# Patient Record
Sex: Male | Born: 1965 | Race: White | Hispanic: No | Marital: Married | Smoking: Never smoker
Health system: Southern US, Community
[De-identification: ages and names within clinical notes are randomized; demographics above are authoritative.]

## PROBLEM LIST (undated history)

## (undated) DIAGNOSIS — F419 Anxiety disorder, unspecified: Secondary | ICD-10-CM

## (undated) DIAGNOSIS — E785 Hyperlipidemia, unspecified: Secondary | ICD-10-CM

## (undated) DIAGNOSIS — J45909 Unspecified asthma, uncomplicated: Secondary | ICD-10-CM

## (undated) DIAGNOSIS — E119 Type 2 diabetes mellitus without complications: Secondary | ICD-10-CM

## (undated) DIAGNOSIS — Z87442 Personal history of urinary calculi: Secondary | ICD-10-CM

## (undated) HISTORY — PX: TONSILLECTOMY: SUR1361

## (undated) HISTORY — PX: TYMPANOSTOMY TUBE PLACEMENT: SHX32

## (undated) HISTORY — PX: HERNIA REPAIR: SHX51

## (undated) HISTORY — DX: Hyperlipidemia, unspecified: E78.5

## (undated) HISTORY — PX: ADENOIDECTOMY: SUR15

## (undated) HISTORY — DX: Type 2 diabetes mellitus without complications: E11.9

## (undated) HISTORY — DX: Unspecified asthma, uncomplicated: J45.909

## (undated) HISTORY — DX: Personal history of urinary calculi: Z87.442

## (undated) HISTORY — DX: Anxiety disorder, unspecified: F41.9

---

## 2001-04-29 ENCOUNTER — Encounter: Admission: RE | Admit: 2001-04-29 | Discharge: 2001-07-28 | Payer: Self-pay | Admitting: Internal Medicine

## 2002-12-14 ENCOUNTER — Encounter: Payer: Self-pay | Admitting: Emergency Medicine

## 2002-12-14 ENCOUNTER — Emergency Department (HOSPITAL_COMMUNITY): Admission: EM | Admit: 2002-12-14 | Discharge: 2002-12-14 | Payer: Self-pay | Admitting: Emergency Medicine

## 2003-05-20 ENCOUNTER — Encounter: Payer: Self-pay | Admitting: Emergency Medicine

## 2003-05-20 ENCOUNTER — Emergency Department (HOSPITAL_COMMUNITY): Admission: EM | Admit: 2003-05-20 | Discharge: 2003-05-20 | Payer: Self-pay | Admitting: Emergency Medicine

## 2003-05-28 ENCOUNTER — Encounter: Payer: Self-pay | Admitting: Urology

## 2003-05-28 ENCOUNTER — Ambulatory Visit (HOSPITAL_COMMUNITY): Admission: RE | Admit: 2003-05-28 | Discharge: 2003-05-28 | Payer: Self-pay | Admitting: Urology

## 2005-09-02 ENCOUNTER — Emergency Department (HOSPITAL_COMMUNITY): Admission: EM | Admit: 2005-09-02 | Discharge: 2005-09-02 | Payer: Self-pay | Admitting: Emergency Medicine

## 2005-09-05 ENCOUNTER — Emergency Department (HOSPITAL_COMMUNITY): Admission: EM | Admit: 2005-09-05 | Discharge: 2005-09-05 | Payer: Self-pay | Admitting: Emergency Medicine

## 2005-09-14 ENCOUNTER — Ambulatory Visit (HOSPITAL_COMMUNITY): Admission: RE | Admit: 2005-09-14 | Discharge: 2005-09-14 | Payer: Self-pay | Admitting: Urology

## 2006-06-10 ENCOUNTER — Emergency Department (HOSPITAL_COMMUNITY): Admission: EM | Admit: 2006-06-10 | Discharge: 2006-06-10 | Payer: Self-pay | Admitting: Emergency Medicine

## 2006-07-04 ENCOUNTER — Emergency Department (HOSPITAL_COMMUNITY): Admission: EM | Admit: 2006-07-04 | Discharge: 2006-07-04 | Payer: Self-pay | Admitting: *Deleted

## 2006-12-03 ENCOUNTER — Encounter: Admission: RE | Admit: 2006-12-03 | Discharge: 2006-12-03 | Payer: Self-pay | Admitting: Internal Medicine

## 2008-06-15 ENCOUNTER — Ambulatory Visit: Payer: Self-pay | Admitting: Internal Medicine

## 2008-07-06 ENCOUNTER — Ambulatory Visit: Payer: Self-pay | Admitting: Internal Medicine

## 2008-12-25 ENCOUNTER — Ambulatory Visit: Payer: Self-pay | Admitting: Internal Medicine

## 2009-05-25 ENCOUNTER — Ambulatory Visit: Payer: Self-pay | Admitting: Internal Medicine

## 2009-09-17 ENCOUNTER — Ambulatory Visit: Payer: Self-pay | Admitting: Internal Medicine

## 2011-01-20 NOTE — H&P (Signed)
Jerry Arroyo, Jerry Arroyo                          ACCOUNT NO.:  [id number]   MEDICAL RECORD NO.:  [id number]                   PATIENT TYPE:  EMS   LOCATION:  [facility]                                 FACILITY:  [facility]   PHYSICIAN:  Celene Kras, MD                    DATE OF BIRTH:  1965-09-28   DATE OF ADMISSION:  05/20/2003  DATE OF DISCHARGE:                                HISTORY & PHYSICAL   IDENTIFYING INFORMATION/JUSTIFICATION FOR ADMISSION AND CARE:  This is a 45-  year-old white male with chief complaint of fever and shaking chills.   HISTORY OF PRESENT ILLNESS:  Acutely, this morning, felt generally poor at  breakfast.  Has been napping ever since off and on.  Has a headache in the  occiput radiating down to the neck.  This is not unusual for him.  Some mild  nausea as well.  Some lower abdominal pain that is mild as well.  He  developed shaking chills so his wife brought him in for evaluation.  He has  no sinus headache, nasal drainage, ear pain, sore throat, sputum production,  nausea, vomiting, diarrhea, constipation, rash or tick bite.  His fever in  the office on a rapid thermometer was 104.3.  A flow thermometer was 101.9.  Urine negative.  CBC with wbc within normal limits.  Chest x-ray was  unremarkable.  Decision was to admit for further evaluation to track febrile  illness.   ALLERGIES:  No known drug allergies.   CURRENT MEDICATIONS:  None.   PAST MEDICAL HISTORY:  1. DJD of the shoulders.  2. Adenomatous polyp of the colon (family history of colon cancer).  3. History of colonic lipoma.  4. History of diverticulosis.  5. History of subcutaneous lipoma.  6. History of elevated cholesterol.   PAST SURGICAL HISTORY:  1. Laser ablation of subcutaneous lipoma at the sigmoid junction by Dr.     Baldo Ash.  2. Adenomatous colon polyp removed.   FAMILY HISTORY:  Mother died at age 10 of colon cancer.  Father died at age  83 of an MI.  He has eight  siblings, four living.  One brother with prostate  cancer.  Three others in good health.  One brother died of an MI.   SOCIAL HISTORY:  Two daughters and one son.  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  He is not a smoker and does not drink  alcohol.   REVIEW OF SYMPTOMS:  Please see chief complaint.   PHYSICAL EXAMINATION:  GENERAL APPEARANCE:  A flushed, frail male with  shaking chills that looks acutely ill.  VITAL SIGNS:  Temperature 104.3, 101.9.  Pulse 72, blood pressure 140/80.  SKIN:  Hot to touch.  No rash.  HEENT:  PERRL.  Ears clear.  Oropharynx clear though dry.  NECK:  Thyroid not enlarged or tender.  No lymphadenopathy.  No carotid  bruit.  CHEST:  Clear.  CARDIOVASCULAR:  Normal S1 and S2 with no murmurs, gallops, rubs or clicks.  ABDOMEN:  Mildly tender in the bilateral lower quadrants.  No masses or  organomegaly.  No rebound or peritonitis.  GENITALIA:  Normal penis, testes normal.  RECTAL:  Good tone.  Nontender prostate.  No enlargement or nodules.  EXTREMITIES:  No clubbing, cyanosis, or edema.  NEUROLOGIC:  Oriented x3.   ASSESSMENT:  Fever of unknown origin.   PLAN:  Admit for further evaluation and treatment.  A consult with Dr. Merlene Laughter.      Jerry Arroyo, N.P.                     Celene Kras, MD    MAP/MEDQ  D:  05/21/2003  T:  05/21/2003  Job:  [id number]

## 2011-09-12 ENCOUNTER — Ambulatory Visit (INDEPENDENT_AMBULATORY_CARE_PROVIDER_SITE_OTHER): Payer: Commercial Managed Care - PPO | Admitting: Internal Medicine

## 2011-09-12 ENCOUNTER — Encounter: Payer: Self-pay | Admitting: Internal Medicine

## 2011-09-12 VITALS — BP 128/90 | HR 84 | Temp 98.5°F | Ht 75.0 in | Wt 253.0 lb

## 2011-09-12 DIAGNOSIS — H6690 Otitis media, unspecified, unspecified ear: Secondary | ICD-10-CM

## 2011-09-12 DIAGNOSIS — J329 Chronic sinusitis, unspecified: Secondary | ICD-10-CM

## 2011-09-12 DIAGNOSIS — H6693 Otitis media, unspecified, bilateral: Secondary | ICD-10-CM

## 2011-09-12 NOTE — Progress Notes (Signed)
  Subjective:    Patient ID: Jerry Arroyo, male    DOB: Apr 29, 1966, 46 y.o.   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 No fever or shaking chills. Did have influenza immunization through his work. Has some discolored sputum production and discolored nasal drainage. Feels stopped up in his head and ears feel stuffy. Not hearing well. No sore throat.    Review of Systems     Objective:   Physical Exam HEENT exam: TMs are dull bilaterally and pink; pharynx slightly injected without exudate; neck is supple. Has left anterior cervical node that is slightly tender. Chest: Clear to auscultation. Sounds nasally congested when he speaks.        Assessment & Plan:  Bilateral otitis media  Sinusitis  Plan: Continue with Mucinex over-the-counter. Has Hycodan cough syrup at home to take at bedtime when necessary cough. Levaquin 500 milligrams per and sees #10 (1 by mouth daily with a meal.

## 2011-09-12 NOTE — Patient Instructions (Addendum)
Take Mucinex over-the-counter as directed. Take Levaquin 500 milligrams daily with a meal for 10 days. Call if not better in 2 weeks. May take Hycodan cough syrup 1 teaspoon at bedtime as needed for cough.

## 2011-10-26 ENCOUNTER — Ambulatory Visit (INDEPENDENT_AMBULATORY_CARE_PROVIDER_SITE_OTHER): Payer: Commercial Managed Care - PPO | Admitting: Internal Medicine

## 2011-10-26 ENCOUNTER — Encounter: Payer: Self-pay | Admitting: Internal Medicine

## 2011-10-26 VITALS — BP 126/92 | HR 80 | Temp 98.2°F | Wt 254.0 lb

## 2011-10-26 DIAGNOSIS — H6690 Otitis media, unspecified, unspecified ear: Secondary | ICD-10-CM

## 2011-10-26 DIAGNOSIS — H6693 Otitis media, unspecified, bilateral: Secondary | ICD-10-CM

## 2011-10-26 DIAGNOSIS — J069 Acute upper respiratory infection, unspecified: Secondary | ICD-10-CM

## 2011-10-26 LAB — POCT RAPID STREP A (OFFICE): Rapid Strep A Screen: NEGATIVE

## 2011-10-26 NOTE — Progress Notes (Signed)
  Subjective:    Patient ID: Jerry Arroyo, male    DOB: 1966/08/13, 46 y.o.   MRN: [id number]  HPI patient here recently with respiratory infection treated with Levaquin. He got better but now has an apparent new respiratory infection with discolored nasal drainage that seems to be going into his chest. No fever or shaking chills. 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    Review of Systems     Objective:   Physical Exam HEENT exam pharynx slightly injected. Rapid strep screen negative. TMs are full and dull bilaterally. Neck is supple without adenopathy. Chest clear.            Assessment & Plan:  URI  Bilateral otitis media  Plan: Biaxin 500 mg by mouth twice daily for 10 days.

## 2011-10-26 NOTE — Patient Instructions (Signed)
Take Biaxin 500 mg by mouth twice daily for 10 days.

## 2011-10-26 NOTE — Progress Notes (Signed)
Addended by: Judy Pimple on: 10/26/2011 12:47 PM   Modules accepted: Orders

## 2011-12-25 ENCOUNTER — Ambulatory Visit (INDEPENDENT_AMBULATORY_CARE_PROVIDER_SITE_OTHER): Payer: Commercial Managed Care - PPO | Admitting: Internal Medicine

## 2011-12-25 VITALS — BP 118/60 | HR 80 | Temp 98.5°F | Resp 20 | Ht 76.0 in | Wt 240.0 lb

## 2011-12-25 DIAGNOSIS — J329 Chronic sinusitis, unspecified: Secondary | ICD-10-CM

## 2011-12-27 NOTE — Progress Notes (Signed)
  Subjective:    Patient ID: Jerry Arroyo, male    DOB: [birth year], 46 y.o.   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 Has had maxillary sinus pressure that started a couple of days ago. Daughter has had sore throat. Patient has had malaise and fatigue over the past 48 hours. Says he feels really awful. Cannot really blow out much discolored sputum. No fever or shaking chills.    Review of Systems     Objective:   Physical Exam boggy nasal mucosa, pharynx is clear, TMs are slightly full bilaterally. Tender  maxillary sinus areas bilaterally.        Assessment & Plan:  Sinusitis  Plan: Amoxicillin 500 mg by mouth 3 times daily for 10 days.

## 2011-12-31 ENCOUNTER — Encounter: Payer: Self-pay | Admitting: Internal Medicine

## 2011-12-31 NOTE — Patient Instructions (Signed)
Take amoxicillin 500 mg 3 times daily as directed for 10 days. Call if Not better in 10 days to 2 weeks.

## 2013-07-21 ENCOUNTER — Encounter: Payer: Self-pay | Admitting: Internal Medicine

## 2013-07-21 ENCOUNTER — Ambulatory Visit (INDEPENDENT_AMBULATORY_CARE_PROVIDER_SITE_OTHER): Payer: Commercial Managed Care - PPO | Admitting: Internal Medicine

## 2013-07-21 VITALS — BP 140/78 | HR 72 | Temp 97.2°F | Ht 76.0 in | Wt 255.0 lb

## 2013-07-21 DIAGNOSIS — E119 Type 2 diabetes mellitus without complications: Secondary | ICD-10-CM

## 2013-07-21 DIAGNOSIS — H6690 Otitis media, unspecified, unspecified ear: Secondary | ICD-10-CM

## 2013-07-21 DIAGNOSIS — Z87442 Personal history of urinary calculi: Secondary | ICD-10-CM

## 2013-07-21 DIAGNOSIS — Z131 Encounter for screening for diabetes mellitus: Secondary | ICD-10-CM

## 2013-07-21 DIAGNOSIS — H6692 Otitis media, unspecified, left ear: Secondary | ICD-10-CM

## 2013-07-21 DIAGNOSIS — H8110 Benign paroxysmal vertigo, unspecified ear: Secondary | ICD-10-CM

## 2013-07-21 LAB — POCT URINALYSIS DIPSTICK
Bilirubin, UA: NEGATIVE
Blood, UA: NEGATIVE
Leukocytes, UA: NEGATIVE
Nitrite, UA: NEGATIVE
Protein, UA: NEGATIVE
Spec Grav, UA: 1.015
Urobilinogen, UA: 0.2
pH, UA: 5.5

## 2013-07-21 LAB — COMPREHENSIVE METABOLIC PANEL
ALT: 49 U/L (ref 0–53)
AST: 34 U/L (ref 0–37)
Albumin: 4.2 g/dL (ref 3.5–5.2)
BUN: 11 mg/dL (ref 6–23)
CO2: 24 mEq/L (ref 19–32)
Calcium: 9.3 mg/dL (ref 8.4–10.5)
Creat: 0.7 mg/dL (ref 0.50–1.35)
Glucose, Bld: 215 mg/dL — ABNORMAL HIGH (ref 70–99)
Potassium: 3.6 mEq/L (ref 3.5–5.3)
Sodium: 139 mEq/L (ref 135–145)
Total Bilirubin: 0.4 mg/dL (ref 0.3–1.2)
Total Protein: 7 g/dL (ref 6.0–8.3)

## 2013-07-22 DIAGNOSIS — Z87442 Personal history of urinary calculi: Secondary | ICD-10-CM | POA: Insufficient documentation

## 2013-07-22 DIAGNOSIS — E119 Type 2 diabetes mellitus without complications: Secondary | ICD-10-CM | POA: Insufficient documentation

## 2013-07-22 LAB — HEMOGLOBIN A1C
Hgb A1c MFr Bld: 7.2 % — ABNORMAL HIGH (ref <5.7)
Mean Plasma Glucose: 160 mg/dL — ABNORMAL HIGH (ref <117)

## 2013-07-22 NOTE — Patient Instructions (Signed)
Take Accu-Cheks before breakfast and supper and keep a record for the next 2 weeks. Take medications as prescribed for ear infection. Follow diabetic diet and exercise

## 2013-07-22 NOTE — Progress Notes (Signed)
  Subjective:    Patient ID: Jerry Arroyo, male    DOB: Dec 21, 1965, 47 y.o.   MRN: [id number]  HPI Complaining of left ear being stopped up for about 4 weeks. Sounds are muffled. Has had a recent URI with a lot of discolored nasal drainage. No fever or chills. No sore throat. Right ear seems to be okay. Has had a bit of vertigo at times. 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  Also he went to see Dr. Bjorn Pippin today regarding kidney stone followup and was found to have glucose in his urine. Says he had it last year but never followed through with me about it. No complaints of polyuria or polydipsia. Urine has 1+ glucose today    Review of Systems     Objective:   Physical Exam left TM is full but clear. Right TM is clear. Pharynx is clear. Neck is supple without adenopathy. Chest is clear to auscultation. Labs drawn including fasting glucose and hemoglobin A1c        Assessment & Plan:  New onset diabetes mellitus  Benign positional vertigo  Left otitis media  Plan: Levaquin 500 milligrams daily for 7 days. Medrol Dosepak 4 mg take as corrected for 6 day period home Accu-Chek machine prescription given along with test strips and lancets to keep record of Accu-Chek of the next 2 weeks or so.  Addendum: Hemoglobin A1c 7.2%. Start following strict diabetic diet and exercise and return in 4-6 weeks

## 2013-07-28 ENCOUNTER — Telehealth: Payer: Self-pay | Admitting: Internal Medicine

## 2013-07-28 NOTE — Telephone Encounter (Signed)
New onset diabetes. Has been checking Accu-Cheks regularly. Has not over respiratory infection completely. Says ear still feels stuffy. New prescription for Levaquin 500 milligrams daily for 7 days.

## 2014-08-14 ENCOUNTER — Encounter: Payer: Self-pay | Admitting: Internal Medicine

## 2014-08-14 ENCOUNTER — Ambulatory Visit (INDEPENDENT_AMBULATORY_CARE_PROVIDER_SITE_OTHER): Payer: 59 | Admitting: Internal Medicine

## 2014-08-14 VITALS — BP 118/78 | HR 87 | Temp 97.8°F | Wt 256.0 lb

## 2014-08-14 DIAGNOSIS — H6693 Otitis media, unspecified, bilateral: Secondary | ICD-10-CM

## 2014-08-14 MED ORDER — AMOXICILLIN-POT CLAVULANATE 875-125 MG PO TABS: 1.0000 | ORAL_TABLET | Freq: Two times a day (BID) | ORAL | Status: DC

## 2014-08-14 NOTE — Patient Instructions (Signed)
Take Augmentin 875 mg twice daily for 10 days. Call if not better in 2 weeks.

## 2014-08-14 NOTE — Progress Notes (Signed)
   Subjective:    Patient ID: Jerry Arroyo, male    DOB: 1965/11/04, 48 y.o.   MRN: [id number]  HPI Patient complaining of a roaring sound and decreased hearing in left ear for about a month. Trouble hearing people over the telephone with left ear. No complaint with right ear. 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 He calls patients on the phone in [place] states.    Review of Systems     Objective:   Physical Exam  Right TM is dull and centrally retracted but not red. Left TM is full but not red. Pharynx is clear. Neck is supple without adenopathy.      Assessment & Plan:  Bilateral otitis media  Plan: Augmentin 875 mg twice daily for 10 days. Call if symptoms do not improve. Patient says he will book a physical exam in the near future.

## 2014-10-08 ENCOUNTER — Encounter: Payer: Self-pay | Admitting: Internal Medicine

## 2014-10-08 ENCOUNTER — Ambulatory Visit (INDEPENDENT_AMBULATORY_CARE_PROVIDER_SITE_OTHER): Payer: 59 | Admitting: Internal Medicine

## 2014-10-08 VITALS — BP 120/82 | HR 86 | Temp 98.0°F

## 2014-10-08 DIAGNOSIS — J069 Acute upper respiratory infection, unspecified: Secondary | ICD-10-CM

## 2014-10-08 DIAGNOSIS — H6505 Acute serous otitis media, recurrent, left ear: Secondary | ICD-10-CM

## 2014-10-08 MED ORDER — HYDROCODONE-HOMATROPINE 5-1.5 MG/5ML PO SYRP: 5.0000 mL | ORAL_SOLUTION | Freq: Three times a day (TID) | ORAL | Status: DC | PRN

## 2014-10-08 MED ORDER — LEVOFLOXACIN 500 MG PO TABS: 500.0000 mg | ORAL_TABLET | Freq: Every day | ORAL | Status: DC

## 2014-10-08 NOTE — Progress Notes (Signed)
   Subjective:    Patient ID: Jerry Arroyo, male    DOB: [birth year], 49 y.o.   MRN: [id number]  HPI Onset URI symptoms 4 days ago. No fever. No sore throat. Had flu vaccine. Trouble hearing out of left ear. Sounds are muffled. Patient was here December 11 treated with Augmentin for complaining of muffled hearing left ear. Says he got some better but now is having issues again. Daughter has had respiratory infection recently. Feels he probably caught that from her. Complaining of maxillary sinus tenderness. Some cough at night.    Review of Systems     Objective:   Physical Exam  Left TM is slightly full with splayed light reflex but not red. Right TM is chronically scarred. Pharynx is clear. Neck is supple. Chest clear.      Assessment & Plan:  Acute URI  Left serous otitis media  Plan: Levaquin 500 milligrams daily for 10 days. Hycodan 1 teaspoon by mouth every 8 hours when necessary cough no refill. May take DayQuil over-the-counter for congestion during the day. It ear symptoms persist, he will need to see ENT physician.

## 2014-10-08 NOTE — Patient Instructions (Signed)
Take Levaquin 500 milligrams daily for 10 days. Take DayQuil for congestion. Call if not better in 10 days or sooner if worse. 2 ENT physician if ear symptoms persist. Hycodan sparingly as needed for cough

## 2014-12-14 ENCOUNTER — Other Ambulatory Visit: Payer: 59 | Admitting: Internal Medicine

## 2014-12-14 DIAGNOSIS — Z125 Encounter for screening for malignant neoplasm of prostate: Secondary | ICD-10-CM

## 2014-12-14 DIAGNOSIS — E119 Type 2 diabetes mellitus without complications: Secondary | ICD-10-CM

## 2014-12-14 DIAGNOSIS — Z13220 Encounter for screening for lipoid disorders: Secondary | ICD-10-CM

## 2014-12-14 DIAGNOSIS — Z0000 Encounter for general adult medical examination without abnormal findings: Secondary | ICD-10-CM

## 2014-12-14 LAB — CBC WITH DIFFERENTIAL/PLATELET
Basophils Absolute: 0 10*3/uL (ref 0.0–0.1)
Basophils Relative: 0 % (ref 0–1)
Eosinophils Absolute: 0.3 10*3/uL (ref 0.0–0.7)
Eosinophils Relative: 4 % (ref 0–5)
HCT: 45.8 % (ref 39.0–52.0)
Hemoglobin: 15.4 g/dL (ref 13.0–17.0)
Lymphocytes Relative: 32 % (ref 12–46)
Lymphs Abs: 2.1 10*3/uL (ref 0.7–4.0)
MCH: 29.5 pg (ref 26.0–34.0)
MCHC: 33.6 g/dL (ref 30.0–36.0)
MCV: 87.7 fL (ref 78.0–100.0)
MPV: 10.3 fL (ref 8.6–12.4)
Monocytes Absolute: 0.6 10*3/uL (ref 0.1–1.0)
Monocytes Relative: 9 % (ref 3–12)
Neutro Abs: 3.6 10*3/uL (ref 1.7–7.7)
Neutrophils Relative %: 55 % (ref 43–77)
Platelets: 267 10*3/uL (ref 150–400)
RBC: 5.22 MIL/uL (ref 4.22–5.81)
RDW: 14 % (ref 11.5–15.5)
WBC: 6.6 10*3/uL (ref 4.0–10.5)

## 2014-12-14 LAB — COMPLETE METABOLIC PANEL WITH GFR
ALT: 48 U/L (ref 0–53)
AST: 33 U/L (ref 0–37)
Albumin: 4.3 g/dL (ref 3.5–5.2)
Alkaline Phosphatase: 53 U/L (ref 39–117)
BUN: 11 mg/dL (ref 6–23)
CO2: 25 mEq/L (ref 19–32)
Calcium: 9.1 mg/dL (ref 8.4–10.5)
Chloride: 102 mEq/L (ref 96–112)
Creat: 0.69 mg/dL (ref 0.50–1.35)
GFR, Est African American: >89 mL/min
GFR, Est Non African American: >89 mL/min
Glucose, Bld: 203 mg/dL — ABNORMAL HIGH (ref 70–99)
Potassium: 4.4 mEq/L (ref 3.5–5.3)
SODIUM: 139 meq/L (ref 135–145)
Total Bilirubin: 0.6 mg/dL (ref 0.2–1.2)
Total Protein: 6.9 g/dL (ref 6.0–8.3)

## 2014-12-14 LAB — LIPID PANEL
Cholesterol: 223 mg/dL — ABNORMAL HIGH (ref 0–200)
HDL: 32 mg/dL — ABNORMAL LOW (ref >=40)
LDL Cholesterol: 139 mg/dL — ABNORMAL HIGH (ref 0–99)
Total CHOL/HDL Ratio: 7 Ratio
Triglycerides: 260 mg/dL — ABNORMAL HIGH (ref <150)
VLDL: 52 mg/dL — AB (ref 0–40)

## 2014-12-14 NOTE — Addendum Note (Signed)
Addended by: Leota Jacobsen on: 12/14/2014 09:38 AM   Modules accepted: Orders

## 2014-12-15 ENCOUNTER — Ambulatory Visit (INDEPENDENT_AMBULATORY_CARE_PROVIDER_SITE_OTHER): Payer: 59 | Admitting: Internal Medicine

## 2014-12-15 ENCOUNTER — Encounter: Payer: Self-pay | Admitting: Internal Medicine

## 2014-12-15 VITALS — BP 126/76 | HR 86 | Temp 98.1°F | Ht 76.0 in | Wt 240.0 lb

## 2014-12-15 DIAGNOSIS — E669 Obesity, unspecified: Secondary | ICD-10-CM | POA: Diagnosis not present

## 2014-12-15 DIAGNOSIS — Z23 Encounter for immunization: Secondary | ICD-10-CM | POA: Diagnosis not present

## 2014-12-15 DIAGNOSIS — E119 Type 2 diabetes mellitus without complications: Secondary | ICD-10-CM

## 2014-12-15 DIAGNOSIS — Z0000 Encounter for general adult medical examination without abnormal findings: Secondary | ICD-10-CM

## 2014-12-15 DIAGNOSIS — E785 Hyperlipidemia, unspecified: Secondary | ICD-10-CM | POA: Diagnosis not present

## 2014-12-15 LAB — HEMOGLOBIN A1C
Hgb A1c MFr Bld: 10 % — ABNORMAL HIGH (ref <5.7)
Mean Plasma Glucose: 240 mg/dL — ABNORMAL HIGH (ref <117)

## 2014-12-15 LAB — POCT URINALYSIS DIPSTICK
Bilirubin, UA: NEGATIVE
Blood, UA: NEGATIVE
Leukocytes, UA: NEGATIVE
Nitrite, UA: NEGATIVE
Protein, UA: NEGATIVE
Spec Grav, UA: 1.02
Urobilinogen, UA: NEGATIVE
pH, UA: 5

## 2014-12-15 LAB — PSA: PSA: 0.81 ng/mL (ref <=4.00)

## 2014-12-15 MED ORDER — ATORVASTATIN CALCIUM 10 MG PO TABS: 10.0000 mg | ORAL_TABLET | Freq: Every day | ORAL | Status: DC

## 2014-12-15 MED ORDER — LINAGLIPTIN 5 MG PO TABS: 5.0000 mg | ORAL_TABLET | Freq: Every day | ORAL | Status: DC

## 2014-12-15 NOTE — Progress Notes (Signed)
Subjective:    Patient ID: Jerry Arroyo, male    DOB: 12/01/1965, 49 y.o.   MRN: [id number]  HPI  49 year old White male in today for health maintenance exam and evaluation of medical issues. He has type 2 diabetes mellitus and is currently not on treatment. He also has hyperlipidemia. Needs to be a medication for both. 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 Discussed diet and exercise. Will refer to Nutrition management at [address]. Has been known to have some glucose intolerance since at least November 2014 when he had some glucose in his urine. At that time was given an Accu-Chek machine. Hemoglobin A1c was 7.2% but did not follow-up. Hemoglobin A1c today is 10%. Also, total cholesterol, LDL cholesterol and triglycerides are elevated. Blood pressure is normal.  Has a history of calcium oxalate kidney stones and is maintained on potassium citrate for that by urologist. Had left lithotripsy 2007.  Doesn't recall date of last tetanus immunization so update given today. Prevnar 13 given today.  The says dialogue it causes nausea and vomiting  Left inguinal hernia repair 1983. Right inguinal hernia repair 1998. Peptic ulcer disease at age 22.  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 Had hepatitis B series 06/17/1991, 07/24/1991, 02/03/1992. In 1999 had a positive hepatitis B titer. This was done by the city of [place]. Had MMR vaccine 10/13/2008.  Social history: He is married. 2 daughters. Wife is a Pharmacist, hospital. Completed nursing school at [employer]. 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 Does not smoke or consume alcohol.  Had normal EKG in 2008.  Lateral epicondylitis right elbow 2005    Review of Systems  Constitutional: Negative.   Eyes:       Not seeing as well. Vision is  somewhat blurry  Respiratory: Negative.   Cardiovascular: Negative.   Gastrointestinal: Negative.   Endocrine: Negative.   Genitourinary: Negative.   Neurological: Negative.   Hematological: Negative.        Objective:   Physical Exam  Constitutional: He is oriented to person, place, and time. He appears well-developed and well-nourished. No distress.  HENT:  Head: Normocephalic and atraumatic.  Right Ear: External ear normal.  Left Ear: External ear normal.  Mouth/Throat: Oropharynx is clear and moist. No oropharyngeal exudate.  Eyes: Conjunctivae and EOM are normal. Pupils are equal, round, and reactive to light. Right eye exhibits no discharge. Left eye exhibits no discharge. No scleral icterus.  Neck: Neck supple. No JVD present. No thyromegaly present.  Cardiovascular: Normal rate, regular rhythm, normal heart sounds and intact distal pulses.   No murmur heard. Pulmonary/Chest: Effort normal and breath sounds normal. No respiratory distress. He has no wheezes. He has no rales. He exhibits no tenderness.  Abdominal: Soft. Bowel sounds are normal. He exhibits no distension and no mass. There is no rebound and no guarding.  Genitourinary: Prostate normal.  Musculoskeletal: Normal range of motion. He exhibits no edema.  Lymphadenopathy:    He has no cervical adenopathy.  Neurological: He is alert and oriented to person, place, and time. He has normal reflexes. No cranial nerve deficit. Coordination normal.  Skin: Skin is warm and dry. No rash noted. He is not diaphoretic. No erythema.  Psychiatric: He has a normal mood and affect. His behavior is normal. Judgment  and thought content normal.  Vitals reviewed.         Assessment & Plan:  New onset diabetes mellitus  Hyperlipidemia  Obesity  Plan: Referral to nutrition management Center. Prescription for Accu-Chek and test strips to check before meals and at bedtime. Return in 3 months for hemoglobin A 1C lipid panel and  liver functions. Start Lipitor 10 mg daily. Start Tradjenta 5 mg daily.

## 2014-12-15 NOTE — Patient Instructions (Addendum)
Start Tradjenta 5 mg daily. Start Lipitor 10 mg daily. Hold off on eye exam for 6-8 weeks. Purchase home glucose monitor and check glucose twice daily before meals. Prevnar and Tdap given. See dietitian regarding diabetes and cholesterol issues. Return in 3 months.

## 2014-12-16 LAB — MICROALBUMIN / CREATININE URINE RATIO
Creatinine, Urine: 75.7 mg/dL
Microalb Creat Ratio: 5.3 mg/g (ref 0.0–30.0)
Microalb, Ur: 0.4 mg/dL (ref <2.0)

## 2015-02-10 ENCOUNTER — Other Ambulatory Visit: Payer: Self-pay | Admitting: *Deleted

## 2015-02-10 MED ORDER — LINAGLIPTIN 5 MG PO TABS: 5.0000 mg | ORAL_TABLET | Freq: Every day | ORAL | Status: DC

## 2015-02-10 NOTE — Telephone Encounter (Signed)
Refill sent on Tradjenta

## 2015-03-12 ENCOUNTER — Other Ambulatory Visit: Payer: 59 | Admitting: Internal Medicine

## 2015-03-12 DIAGNOSIS — E119 Type 2 diabetes mellitus without complications: Secondary | ICD-10-CM

## 2015-03-12 DIAGNOSIS — E785 Hyperlipidemia, unspecified: Secondary | ICD-10-CM

## 2015-03-12 DIAGNOSIS — Z79899 Other long term (current) drug therapy: Secondary | ICD-10-CM

## 2015-03-12 LAB — LIPID PANEL
Cholesterol: 163 mg/dL (ref 0–200)
HDL: 37 mg/dL — AB (ref >=40)
LDL Cholesterol: 106 mg/dL — ABNORMAL HIGH (ref 0–99)
Total CHOL/HDL Ratio: 4.4 Ratio
Triglycerides: 102 mg/dL (ref <150)
VLDL: 20 mg/dL (ref 0–40)

## 2015-03-12 LAB — HEPATIC FUNCTION PANEL
ALT: 15 U/L (ref 0–53)
AST: 12 U/L (ref 0–37)
Albumin: 4 g/dL (ref 3.5–5.2)
Alkaline Phosphatase: 47 U/L (ref 39–117)
BILIRUBIN INDIRECT: 0.3 mg/dL (ref 0.2–1.2)
BILIRUBIN TOTAL: 0.4 mg/dL (ref 0.2–1.2)
Bilirubin, Direct: 0.1 mg/dL (ref 0.0–0.3)
Total Protein: 7.1 g/dL (ref 6.0–8.3)

## 2015-03-12 LAB — HEMOGLOBIN A1C
Hgb A1c MFr Bld: 6.6 % — ABNORMAL HIGH (ref <5.7)
Mean Plasma Glucose: 143 mg/dL — ABNORMAL HIGH (ref <117)

## 2015-03-15 ENCOUNTER — Encounter: Payer: Self-pay | Admitting: Internal Medicine

## 2015-03-15 ENCOUNTER — Ambulatory Visit (INDEPENDENT_AMBULATORY_CARE_PROVIDER_SITE_OTHER): Payer: 59 | Admitting: Internal Medicine

## 2015-03-15 VITALS — BP 118/82 | HR 71 | Temp 97.4°F | Wt 242.0 lb

## 2015-03-15 DIAGNOSIS — E119 Type 2 diabetes mellitus without complications: Secondary | ICD-10-CM | POA: Diagnosis not present

## 2015-03-15 DIAGNOSIS — E785 Hyperlipidemia, unspecified: Secondary | ICD-10-CM

## 2015-03-15 DIAGNOSIS — E669 Obesity, unspecified: Secondary | ICD-10-CM

## 2015-03-15 NOTE — Patient Instructions (Signed)
Continue medications as previously prescribed. Get eyes checked. Continue same medications. Work on diet and exercise. Return in 4 months

## 2015-03-15 NOTE — Progress Notes (Signed)
   Subjective:    Patient ID: Jerry Arroyo, male    DOB: 20-Sep-1965, 49 y.o.   MRN: [id number]  HPI 49 year old Male with recent diagnosis of diabetes out of control with hemoglobin A1c at last visit 3 months ago 10%. He has now gotten it down to 6.6% with Tradjenta. He's tolerating that well. He is watching his diet. He was also started on Lipitor 10 mg daily for hyperlipidemia. His lipid panel is now much improved with LDL improving from 139-106. Triglycerides improved from 260-102. Total cholesterol improved from 223-163. His weight is 242 pounds. He needs to continue with weight loss efforts. He's actually gained 2 pounds since last visit in April.  In April he was given pneumococcal 13 vaccine. This was updated in his record today.  He has yet to get diabetic eye exam.      Review of Systems     Objective:   Physical Exam  Neck is supple without JVD thyromegaly or carotid bruits. Chest clear. Cardiac exam regular rate and rhythm. Extremities without edema      Assessment & Plan:  Controlled type 2 diabetes mellitus  Hyperlipidemia  Obesity  Plan: Patient will return in 4 months for office visit, hemoglobin A1c lipid panel with known liver functions. Liver panel was normal today. Needs to get eyes examined. Continue to work on diet and exercise and weight loss efforts.  I am holding off on starting him on ACE inhibitor or ARB at present. Blood pressure is 118/82.

## 2015-05-26 ENCOUNTER — Other Ambulatory Visit: Payer: Self-pay | Admitting: *Deleted

## 2015-05-26 MED ORDER — ATORVASTATIN CALCIUM 10 MG PO TABS: 10.0000 mg | ORAL_TABLET | Freq: Every day | ORAL | Status: DC

## 2015-07-09 ENCOUNTER — Other Ambulatory Visit: Payer: 59 | Admitting: Internal Medicine

## 2015-07-09 DIAGNOSIS — E118 Type 2 diabetes mellitus with unspecified complications: Secondary | ICD-10-CM

## 2015-07-09 LAB — HEMOGLOBIN A1C
Hgb A1c MFr Bld: 6.5 % — ABNORMAL HIGH (ref <5.7)
Mean Plasma Glucose: 140 mg/dL — ABNORMAL HIGH (ref <117)

## 2015-07-10 LAB — MICROALBUMIN, URINE: MICROALB UR: 0.7 mg/dL

## 2015-07-12 ENCOUNTER — Other Ambulatory Visit: Payer: 59 | Admitting: Internal Medicine

## 2015-07-12 ENCOUNTER — Encounter: Payer: Self-pay | Admitting: Internal Medicine

## 2015-07-12 ENCOUNTER — Other Ambulatory Visit: Payer: Self-pay

## 2015-07-12 ENCOUNTER — Ambulatory Visit (INDEPENDENT_AMBULATORY_CARE_PROVIDER_SITE_OTHER): Payer: 59 | Admitting: Internal Medicine

## 2015-07-12 VITALS — BP 112/70 | HR 78 | Temp 97.5°F | Resp 18 | Ht 76.0 in | Wt 241.0 lb

## 2015-07-12 DIAGNOSIS — E785 Hyperlipidemia, unspecified: Secondary | ICD-10-CM | POA: Diagnosis not present

## 2015-07-12 DIAGNOSIS — H9192 Unspecified hearing loss, left ear: Secondary | ICD-10-CM

## 2015-07-12 DIAGNOSIS — E119 Type 2 diabetes mellitus without complications: Secondary | ICD-10-CM | POA: Diagnosis not present

## 2015-07-12 MED ORDER — METFORMIN HCL 500 MG PO TABS: 500.0000 mg | ORAL_TABLET | Freq: Two times a day (BID) | ORAL | Status: DC

## 2015-07-12 NOTE — Progress Notes (Signed)
   Subjective:    Patient ID: Jerry Arroyo, male    DOB: 08-06-1966, 49 y.o.   MRN: [id number]  HPI  He is in today to follow-up on controlled type 2 diabetes mellitus. He is on for gentle. Hemoglobin A1c has improved from 6.6% 4 months ago 6.5%. 7 months ago was 10%. Says he's can tell his diabetic control seems to be high in the mornings. We talked about diet and exercise. He's complaining of hearing loss left ear. May need to see ENT physician.    Review of Systems     Objective:   Physical Exam  Spent 20 minutes speaking with patient about diabetic control. Lipid panel not checked this time. He remains on lipid-lowering medication.    Assessment & Plan:  Controlled type 2 diabetes mellitus  Hyperlipidemia-to be checked in April  Hearing loss-to see ENT physician  Plan: Were going to add metformin 500 mg twice daily to Tradjenta 5 mg daily. He will follow-up in late January with hemoglobin A1c without office visit. Physical exam due April 2017 and will be booked today. He will call if diarrhea develops well on metformin.

## 2015-07-12 NOTE — Patient Instructions (Signed)
Add metformin 500 mg twice daily to [place]. Return in late January for hemoglobin A1c without office visit. Physical exam booked for April 2017.

## 2015-07-13 MED ORDER — METFORMIN HCL 500 MG PO TABS: 500.0000 mg | ORAL_TABLET | Freq: Two times a day (BID) | ORAL | Status: DC

## 2015-07-15 ENCOUNTER — Other Ambulatory Visit: Payer: Self-pay | Admitting: Internal Medicine

## 2015-10-01 ENCOUNTER — Other Ambulatory Visit: Payer: 59 | Admitting: Internal Medicine

## 2015-10-01 DIAGNOSIS — E119 Type 2 diabetes mellitus without complications: Secondary | ICD-10-CM

## 2015-10-01 LAB — HEMOGLOBIN A1C
Hgb A1c MFr Bld: 6.3 % — ABNORMAL HIGH (ref <5.7)
Mean Plasma Glucose: 134 mg/dL — ABNORMAL HIGH (ref <117)

## 2015-10-04 ENCOUNTER — Other Ambulatory Visit: Payer: Self-pay | Admitting: Internal Medicine

## 2015-11-25 ENCOUNTER — Encounter: Payer: Self-pay | Admitting: Internal Medicine

## 2015-11-25 ENCOUNTER — Ambulatory Visit (INDEPENDENT_AMBULATORY_CARE_PROVIDER_SITE_OTHER): Payer: 59 | Admitting: Internal Medicine

## 2015-11-25 VITALS — BP 116/82 | HR 92 | Temp 98.0°F | Resp 18 | Ht 76.0 in | Wt 244.0 lb

## 2015-11-25 DIAGNOSIS — H9192 Unspecified hearing loss, left ear: Secondary | ICD-10-CM

## 2015-11-25 NOTE — Patient Instructions (Addendum)
To be referred to Dr. Idelle Crouch at [facility] for evaluation.

## 2015-11-25 NOTE — Progress Notes (Signed)
   Subjective:    Patient ID: Jerry Arroyo, male    DOB: 1966/06/11, 50 y.o.   MRN: [id number]  HPI He is back today complaining of a roaring once again in left ear. Says it feels like a ocean in his left ear. He has diabetes and hyperlipidemia. 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 No recent URI symptoms. Says he's noticed decreased hearing in his left ear for the past year to year and a half. He tried like a flavonoid without any relief. He denies tinnitus or high-pitched ringing in his ear. Clearly says it sounds like [place]. Sometimes feels very slightly dizzy if he spends around too quickly but has no frank Ryan spinning vertigo. No headache.    Review of Systems     Objective:   Physical Exam  Both TMs appear to be chronically scarred. The left TM is centrally injected. Hearing is normal in the right ear at 20 dB. He has hearing loss at 20 and 40 dB in his left ear. Cannot hear 500 nor 1000 Hz at either 20 or 40 dB. His pharynx is clear. Neck supple. Chest clear.      Assessment & Plan:  Hearing loss left ear  Plan: Refer to [place] at  [facility] for evaluation.

## 2015-11-29 ENCOUNTER — Other Ambulatory Visit: Payer: Self-pay

## 2015-11-29 MED ORDER — LEVOFLOXACIN 500 MG PO TABS: 500.0000 mg | ORAL_TABLET | Freq: Every day | ORAL | Status: DC

## 2015-11-29 NOTE — Telephone Encounter (Signed)
Patient was seen last week for trouble hearing. Was referred to ENT-Dr. Idelle Crouch- but will not be seen until May. He states that you and him had discussed an antibiotic if things didn't clear up. He states that things are getting worse, even since last week. Please advise.  Per Dr. Renold Genta give levaquin 500mg  1 po q day x 10 days. Levaquin sent to pharmacy.

## 2016-01-08 ENCOUNTER — Other Ambulatory Visit: Payer: Self-pay | Admitting: Internal Medicine

## 2016-01-10 NOTE — Telephone Encounter (Signed)
RF x 3 months

## 2016-02-18 ENCOUNTER — Other Ambulatory Visit: Payer: 59 | Admitting: Internal Medicine

## 2016-02-18 DIAGNOSIS — E785 Hyperlipidemia, unspecified: Secondary | ICD-10-CM

## 2016-02-18 DIAGNOSIS — E119 Type 2 diabetes mellitus without complications: Secondary | ICD-10-CM

## 2016-02-18 DIAGNOSIS — Z79899 Other long term (current) drug therapy: Secondary | ICD-10-CM

## 2016-02-18 DIAGNOSIS — Z125 Encounter for screening for malignant neoplasm of prostate: Secondary | ICD-10-CM

## 2016-02-18 DIAGNOSIS — Z0000 Encounter for general adult medical examination without abnormal findings: Secondary | ICD-10-CM

## 2016-02-18 LAB — COMPLETE METABOLIC PANEL WITH GFR
ALK PHOS: 49 U/L (ref 40–115)
ALT: 17 U/L (ref 9–46)
AST: 14 U/L (ref 10–35)
Albumin: 4.2 g/dL (ref 3.6–5.1)
BILIRUBIN TOTAL: 0.6 mg/dL (ref 0.2–1.2)
BUN: 11 mg/dL (ref 7–25)
CALCIUM: 9 mg/dL (ref 8.6–10.3)
CHLORIDE: 99 mmol/L (ref 98–110)
CO2: 28 mmol/L (ref 20–31)
Creat: 0.69 mg/dL — ABNORMAL LOW (ref 0.70–1.33)
GFR, Est African American: >89 mL/min (ref >=60)
GFR, Est Non African American: >89 mL/min (ref >=60)
GLUCOSE: 109 mg/dL — AB (ref 65–99)
POTASSIUM: 3.9 mmol/L (ref 3.5–5.3)
SODIUM: 138 mmol/L (ref 135–146)
Total Protein: 7.2 g/dL (ref 6.1–8.1)

## 2016-02-18 LAB — CBC WITH DIFFERENTIAL/PLATELET
Basophils Absolute: 88 cells/uL (ref 0–200)
Basophils Relative: 1 %
EOS PCT: 2 %
Eosinophils Absolute: 176 cells/uL (ref 15–500)
HCT: 41.8 % (ref 38.5–50.0)
Hemoglobin: 14.3 g/dL (ref 13.2–17.1)
LYMPHS PCT: 26 %
Lymphs Abs: 2288 cells/uL (ref 850–3900)
MCH: 29.7 pg (ref 27.0–33.0)
MCHC: 34.2 g/dL (ref 32.0–36.0)
MCV: 86.7 fL (ref 80.0–100.0)
MPV: 9.5 fL (ref 7.5–12.5)
Monocytes Absolute: 968 cells/uL — ABNORMAL HIGH (ref 200–950)
Monocytes Relative: 11 %
NEUTROS PCT: 60 %
Neutro Abs: 5280 cells/uL (ref 1500–7800)
Platelets: 243 10*3/uL (ref 140–400)
RBC: 4.82 MIL/uL (ref 4.20–5.80)
RDW: 14.1 % (ref 11.0–15.0)
WBC: 8.8 10*3/uL (ref 3.8–10.8)

## 2016-02-18 LAB — LIPID PANEL
CHOL/HDL RATIO: 3.5 ratio (ref <=5.0)
CHOLESTEROL: 151 mg/dL (ref 125–200)
HDL: 43 mg/dL (ref >=40)
LDL Cholesterol: 78 mg/dL (ref <130)
Triglycerides: 148 mg/dL (ref <150)
VLDL: 30 mg/dL (ref <30)

## 2016-02-19 LAB — PSA: PSA: 1.03 ng/mL (ref <=4.00)

## 2016-02-19 LAB — HEMOGLOBIN A1C
HEMOGLOBIN A1C: 6.7 % — AB (ref <5.7)
Mean Plasma Glucose: 146 mg/dL

## 2016-02-19 LAB — MICROALBUMIN, URINE: MICROALB UR: 1.2 mg/dL

## 2016-02-21 ENCOUNTER — Ambulatory Visit (INDEPENDENT_AMBULATORY_CARE_PROVIDER_SITE_OTHER): Payer: 59 | Admitting: Internal Medicine

## 2016-02-21 ENCOUNTER — Encounter: Payer: Self-pay | Admitting: Internal Medicine

## 2016-02-21 VITALS — BP 138/84 | HR 89 | Temp 98.5°F | Resp 18 | Ht 76.0 in | Wt 253.0 lb

## 2016-02-21 DIAGNOSIS — E785 Hyperlipidemia, unspecified: Secondary | ICD-10-CM

## 2016-02-21 DIAGNOSIS — Z23 Encounter for immunization: Secondary | ICD-10-CM

## 2016-02-21 DIAGNOSIS — Z87442 Personal history of urinary calculi: Secondary | ICD-10-CM

## 2016-02-21 DIAGNOSIS — H8109 Meniere's disease, unspecified ear: Secondary | ICD-10-CM | POA: Diagnosis not present

## 2016-02-21 DIAGNOSIS — E119 Type 2 diabetes mellitus without complications: Secondary | ICD-10-CM | POA: Diagnosis not present

## 2016-02-21 DIAGNOSIS — Z0000 Encounter for general adult medical examination without abnormal findings: Secondary | ICD-10-CM | POA: Diagnosis not present

## 2016-02-21 LAB — POCT URINALYSIS DIPSTICK
BILIRUBIN UA: NEGATIVE
GLUCOSE UA: NEGATIVE
KETONES UA: NEGATIVE
Leukocytes, UA: NEGATIVE
NITRITE UA: NEGATIVE
PH UA: 7
Protein, UA: NEGATIVE
RBC UA: NEGATIVE
Spec Grav, UA: 1.015
Urobilinogen, UA: 0.2

## 2016-02-21 MED ORDER — PNEUMOCOCCAL VAC POLYVALENT 25 MCG/0.5ML IJ INJ
0.5000 mL | INJECTION | Freq: Once | INTRAMUSCULAR | Status: AC
  Administered 2016-02-21: 0.5 mL via INTRAMUSCULAR

## 2016-02-21 NOTE — Progress Notes (Signed)
 Subjective:    Patient ID: Jerry Arroyo, male    DOB: 11/18/1965, 50 y.o.   MRN: [id number]  HPI 50 year old Male for health maintenance exam and evaluation of medical issues. Recently diagnosed with Meniere's disease by Dr. Pillsbury and started with HCTZ 12.5 mg x 2 daily.  Hx of diabetes mellitus. Hgb AIC has increased.History of hyperlipidemia. Parents moving to [place] in July. Father has dementia. 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 Doesn't do a lot of regular exercise outside of that.  History of glucose intolerance since at least November 2014 when he was noted have glucose in his urine. Hemoglobin A1c was 7.2%  but did not follow-up. Hemoglobin A1c in April 2016 was 10%.  He has a history of calcium oxalate kidney stones. He is followed by urologist and is maintained on potassium citrate for that. He had left lithotripsy 2007.  Patient says that after his last visit here April 2016 at which time a rectal exam was done that he came down with a severe case of prostatitis which was treated by urologist. Had never had prostatitis previously.  Prevnar 13 given in 2016 and Pneumovax 23 given today. Had tetanus immunization April 2016.  Left inguinal hernia repair 1983. Right inguinal hernia repair 1998. Peptic ulcer disease at age 13.  Had normal EKG in 2008  Right lateral epicondylitis 2005   In 1999, had positive Hepatitis B titer. 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 Had hepatitis B series 06/17/1991, 07/24/1991, 02/03/1992.  Social history: He is married. 2 daughters. Wife is a teacher. He completed nursing school at [employer]. 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 He works from home.  Family history: Father with dementia. Daughters in good  health.    Review of Systems  Constitutional: Positive for fatigue.  HENT:       Dizziness improved  Respiratory: Negative.   Cardiovascular: Negative.   Gastrointestinal: Negative.   Musculoskeletal: Negative.   Skin: Negative.   Psychiatric/Behavioral: Negative.        Objective:   Physical Exam  Constitutional: He is oriented to person, place, and time. He appears well-developed and well-nourished.  HENT:  Head: Normocephalic and atraumatic.  Right Ear: External ear normal.  Left Ear: External ear normal.  Mouth/Throat: Oropharynx is clear and moist. No oropharyngeal exudate.  Eyes: Conjunctivae and EOM are normal. Pupils are equal, round, and reactive to light. Right eye exhibits no discharge. Left eye exhibits no discharge.  Neck: Neck supple. No JVD present. No thyromegaly present.  Cardiovascular: Normal rate, regular rhythm, normal heart sounds and intact distal pulses.   No murmur heard. Pulmonary/Chest: Effort normal and breath sounds normal. No respiratory distress. He has no wheezes. He has no rales.  Abdominal: Soft. Bowel sounds are normal. He exhibits no distension and no mass. There is no tenderness. There is no rebound and no guarding.  Genitourinary:  Deferred to Dr. John Wrenn  Musculoskeletal: He exhibits no edema.  Lymphadenopathy:    He has no cervical adenopathy.  Neurological: He is alert and oriented to person, place, and time. He has normal reflexes. No cranial nerve deficit.  Skin: Skin is warm and dry. No rash noted.  Psychiatric: He has a normal mood and affect. His behavior is normal. Judgment and thought content normal.    Vitals reviewed.         Assessment & Plan:  Meniere's disease-Seen by Dr. Idelle Crouch and started on HCTZ with improvement in hearing  Controlled type 2 diabetes mellitus-Hemoglobin A1c 6.7%. Patient attributes this to stress with dealing with parents at present time  Hyperlipidemia-stable and normal on statin  medication  History of kidney stones  Plan: Continue to Trajenta 5 mg daily. Continue metformin. Follow-up in 6 months. Continue Lipitor daily.  Continue to monitor Accu-Cheks at least once daily. Immunizations are up-to-date. Pneumococcal 23 vaccine given today.

## 2016-02-22 NOTE — Patient Instructions (Signed)
Continue same medications and return in 6 months. Please have urologist do prostate exam. Pneumococcal 23 vaccine given today.

## 2016-03-04 ENCOUNTER — Other Ambulatory Visit: Payer: Self-pay | Admitting: Internal Medicine

## 2016-08-17 ENCOUNTER — Other Ambulatory Visit: Payer: 59 | Admitting: Internal Medicine

## 2016-08-17 DIAGNOSIS — E119 Type 2 diabetes mellitus without complications: Secondary | ICD-10-CM

## 2016-08-17 DIAGNOSIS — Z87442 Personal history of urinary calculi: Secondary | ICD-10-CM

## 2016-08-17 DIAGNOSIS — E785 Hyperlipidemia, unspecified: Secondary | ICD-10-CM

## 2016-08-17 LAB — HEMOGLOBIN A1C
HEMOGLOBIN A1C: 6.1 % — AB (ref <5.7)
MEAN PLASMA GLUCOSE: 128 mg/dL

## 2016-08-17 LAB — LIPID PANEL
CHOL/HDL RATIO: 4.4 ratio (ref <5.0)
CHOLESTEROL: 167 mg/dL (ref <200)
HDL: 38 mg/dL — ABNORMAL LOW (ref >40)
LDL Cholesterol: 85 mg/dL (ref <100)
Triglycerides: 218 mg/dL — ABNORMAL HIGH (ref <150)
VLDL: 44 mg/dL — ABNORMAL HIGH (ref <30)

## 2016-08-17 LAB — HEPATIC FUNCTION PANEL
ALBUMIN: 4.6 g/dL (ref 3.6–5.1)
ALK PHOS: 42 U/L (ref 40–115)
ALT: 20 U/L (ref 9–46)
AST: 15 U/L (ref 10–35)
BILIRUBIN DIRECT: 0.1 mg/dL (ref <=0.2)
BILIRUBIN TOTAL: 0.6 mg/dL (ref 0.2–1.2)
Indirect Bilirubin: 0.5 mg/dL (ref 0.2–1.2)
TOTAL PROTEIN: 7.5 g/dL (ref 6.1–8.1)

## 2016-08-18 LAB — MICROALBUMIN, URINE: Microalb, Ur: 0.5 mg/dL

## 2016-08-21 ENCOUNTER — Encounter: Payer: Self-pay | Admitting: Internal Medicine

## 2016-08-21 ENCOUNTER — Ambulatory Visit (INDEPENDENT_AMBULATORY_CARE_PROVIDER_SITE_OTHER): Payer: 59 | Admitting: Internal Medicine

## 2016-08-21 VITALS — BP 116/78 | HR 81 | Temp 98.5°F | Ht 76.0 in | Wt 247.0 lb

## 2016-08-21 DIAGNOSIS — E119 Type 2 diabetes mellitus without complications: Secondary | ICD-10-CM | POA: Diagnosis not present

## 2016-08-21 DIAGNOSIS — E782 Mixed hyperlipidemia: Secondary | ICD-10-CM

## 2016-08-21 DIAGNOSIS — H8109 Meniere's disease, unspecified ear: Secondary | ICD-10-CM

## 2016-08-21 MED ORDER — HYDROCHLOROTHIAZIDE 12.5 MG PO TABS: 25.0000 mg | ORAL_TABLET | Freq: Every day | ORAL | 2 refills | Status: DC

## 2016-08-21 NOTE — Patient Instructions (Signed)
Continue same medications and return in 6 months. Watch diet.

## 2016-08-21 NOTE — Progress Notes (Signed)
   Subjective:    Patient ID: Jerry Arroyo, male    DOB: 1965/12/01, 50 y.o.   MRN: [id number]  HPI 50 year old Male for 6 month follow up of DM and Hyperlipidemia. Triglycerides elevated at 218. History of Mnire's disease treated with HCTZ. This was refilled today. Has not been checking blood pressure on a regular basis. Probably needs to do that but blood pressure is acceptable today. Father has been diagnosed with diabetes as well. He remains on for gentle and metformin. Hemoglobin A1c has improved considerably from 6.7% to 6.1%. Remains on Lipitor 10 mg daily. We have discussed his elevated triglycerides. He'll continue to try to diet and exercise and we will follow up in 6 months. We may need to go up on Lipitor that time if this does not improve.    Review of Systems no new complaints. Has had diabetic eye exam and flu vaccine.     Objective:   Physical Exam Skin warm and dry. Nodes none. Neck is supple without JVD thyromegaly or carotid bruits. Chest clear to auscultation. Cardiac exam regular rate and rhythm normal S1 and S2. Extremities without edema.       Assessment & Plan:  Controlled type 2 diabetes mellitus-hemoglobin A1c excellent at 6.1%. Continue Cogentin metformin  Hypertriglyceridemia-continue Lipitor 10 mg daily and reevaluate in 6 months  Mnire's disease-continue HCTZ  Plan: Physical exam due in 6 months.

## 2016-09-04 HISTORY — PX: LAMINECTOMY AND MICRODISCECTOMY CERVICAL SPINE: SHX1912

## 2016-09-17 ENCOUNTER — Other Ambulatory Visit: Payer: Self-pay | Admitting: Internal Medicine

## 2016-10-06 ENCOUNTER — Other Ambulatory Visit: Payer: Self-pay | Admitting: Internal Medicine

## 2016-11-21 ENCOUNTER — Other Ambulatory Visit: Payer: Self-pay | Admitting: Internal Medicine

## 2016-12-18 ENCOUNTER — Encounter: Payer: Self-pay | Admitting: Internal Medicine

## 2016-12-18 ENCOUNTER — Ambulatory Visit (INDEPENDENT_AMBULATORY_CARE_PROVIDER_SITE_OTHER): Payer: 59 | Admitting: Internal Medicine

## 2016-12-18 VITALS — BP 120/80 | HR 73 | Temp 98.0°F | Wt 250.0 lb

## 2016-12-18 DIAGNOSIS — M546 Pain in thoracic spine: Secondary | ICD-10-CM | POA: Diagnosis not present

## 2016-12-18 DIAGNOSIS — G5601 Carpal tunnel syndrome, right upper limb: Secondary | ICD-10-CM

## 2016-12-18 DIAGNOSIS — J069 Acute upper respiratory infection, unspecified: Secondary | ICD-10-CM

## 2016-12-18 MED ORDER — LEVOFLOXACIN 500 MG PO TABS: 500.0000 mg | ORAL_TABLET | Freq: Every day | ORAL | 0 refills | Status: DC

## 2016-12-18 MED ORDER — METHYLPREDNISOLONE ACETATE 80 MG/ML IJ SUSP
80.0000 mg | Freq: Once | INTRAMUSCULAR | Status: AC
  Administered 2016-12-18: 80 mg via INTRAMUSCULAR

## 2016-12-18 MED ORDER — CYCLOBENZAPRINE HCL 10 MG PO TABS: 10.0000 mg | ORAL_TABLET | Freq: Three times a day (TID) | ORAL | 0 refills | Status: DC | PRN

## 2016-12-18 NOTE — Patient Instructions (Signed)
Right parathoracic trigger point injected with Marcaine Xylocaine and Depo-Medrol. May take Flexeril at bedtime. Continue Naprosyn. Ice area for 20 minutes once or twice daily. 4 respiratory infection prescribed Levaquin 500 milligrams daily for 10 days.

## 2016-12-18 NOTE — Addendum Note (Signed)
Addended by: Inocencio Homes on: 12/18/2016 01:12 PM   Modules accepted: Orders

## 2016-12-18 NOTE — Progress Notes (Signed)
   Subjective:    Patient ID: Jerry Arroyo, male    DOB: 05/03/1966, 51 y.o.   MRN: [id number]  HPI  51 year old male with URI symptoms. Has cough and congestion but no fever or shaking chills. What is bothering him more today is some right parathoracic pain. He works a lot at [employer] and uses his right arm. Has some numbness and first 3 fingers of his right hand but is complaining bitterly of point tenderness in the right parathoracic muscle area with point tenderness on exam.    Review of Systems see above     Objective:   Physical Exam  He is tender in the right parathoracic muscle area. There is a trigger point present there that was injected today with Depo-Medrol Marcaine and Xylocaine. Pharynx is clear. TMs are clear. Neck is supple. Chest clear to auscultation.      Assessment & Plan:  Acute URI  Right carpal tunnel syndrome  Trigger point right parathoracic area  Plan: Trigger point injected as above. May take Naprosyn twice daily for pain. Ice area down for 20 minutes once or twice daily. Flexeril 10 mg one half to one tablet at bedtime. May need to wear wrist splint for carpal tunnel syndrome.

## 2016-12-20 ENCOUNTER — Ambulatory Visit (INDEPENDENT_AMBULATORY_CARE_PROVIDER_SITE_OTHER): Payer: 59 | Admitting: Internal Medicine

## 2016-12-20 ENCOUNTER — Telehealth: Payer: Self-pay | Admitting: Internal Medicine

## 2016-12-20 ENCOUNTER — Encounter: Payer: Self-pay | Admitting: Internal Medicine

## 2016-12-20 VITALS — BP 110/80

## 2016-12-20 DIAGNOSIS — M5412 Radiculopathy, cervical region: Secondary | ICD-10-CM

## 2016-12-20 MED ORDER — METHYLPREDNISOLONE 4 MG PO TABS: ORAL_TABLET | ORAL | 1 refills | Status: DC

## 2016-12-20 MED ORDER — HYDROCODONE-ACETAMINOPHEN 10-325 MG PO TABS: 1.0000 | ORAL_TABLET | ORAL | 0 refills | Status: DC | PRN

## 2016-12-20 NOTE — Telephone Encounter (Signed)
Called pt and asked him to come in today.

## 2016-12-20 NOTE — Progress Notes (Signed)
   Subjective:    Patient ID: Jerry Arroyo, male    DOB: [birth year], 51 y.o.   MRN: [id number]  HPI He and  his wife were here for respiratory infections on April 16. He was treated with an antibiotic. At that time, he was complaining of some point tenderness in the right upper parathoracic area. It appeared to be a trigger point that was injected with Depo-Medrol Marcaine and Xylocaine. He was given Flexeril for pain and told to use ice for spasm. He was complaining of some numbness in his right 3 fingers. 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 I thought he had carpal tunnel syndrome at that time.  He called back today saying he was having excruciating pain in his right upper back area. Feels that the numbness in his right hand is worse. Not getting relief with Flexeril.  He was advised to return to the office today.  He says his right hand feels completely numb. Feels that his grip is not as strong as it should be.  Denies any heavy lifting or injury. 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  No prior history of cervical spine issues.  Hx  hyperlipidemia and DM. Hx  Meniere's disease treated with HCTZ.    Review of Systems     Objective:   Physical Exam Decreased sensation to pinprick fingers of right hand more so first 3 fingers. Sensation normal in fingers right hands. Grips are about equal but he feels a difference in right hand grip. DTR's RUE are OK and muscle strength appears normal right arm.       Assessment & Plan:  Suspect cervical radiculopathy from cervical impingement such as a herniated disc.  Plan: Medrol 4 mg 6 day dosepack. Norco 10/325 q 4-6 hours prn pain. Continue Flexeril. MRI of Cspine without contrast.

## 2016-12-20 NOTE — Telephone Encounter (Signed)
Patient states that he is no better.  In fact, he is in excrutiating pain now.  States that his right arm - his fingers are numb.  States that his grip on the right side are about 1/2 the grip/strength that they normally are.  He wants to know what he should do next?  Wants to know if he should see a Neurosurgeon or what next?  States that the shot didn't help and the muscle relaxers are not touching the pain or helping at all.

## 2016-12-20 NOTE — Patient Instructions (Signed)
Try to get MRI of C spine approved. Medrol 4 mg dosepack. Continue Flexeril. Norco 10/325 for pain.

## 2016-12-21 ENCOUNTER — Telehealth: Payer: Self-pay | Admitting: Internal Medicine

## 2016-12-21 NOTE — Telephone Encounter (Signed)
MRI Cervical Spine (Neck) without contrast scheduled for 5/3 @ 11:35.  Spoke with Loni Dolly @ [facility] [phone]; Josem Kaufmann #[id number] eff 12/21/16 - 02/04/17.  CPT Code [id number], Dx: M54.12.  Spoke with Manus Gunning at [facility] to schedule and she had already scheduled with patient for 5/3.  Then called patient to provide authorization number.   Patient advised he would call [facility] everyday to see if there's any cancellation's to see if he can get in any sooner.

## 2016-12-25 ENCOUNTER — Ambulatory Visit
Admission: RE | Admit: 2016-12-25 | Discharge: 2016-12-25 | Disposition: A | Discharge status: Home / Self Care | Payer: 59 | Source: Ambulatory Visit | Attending: Internal Medicine | Admitting: Internal Medicine

## 2016-12-28 ENCOUNTER — Ambulatory Visit (INDEPENDENT_AMBULATORY_CARE_PROVIDER_SITE_OTHER): Payer: 59 | Admitting: Internal Medicine

## 2016-12-28 ENCOUNTER — Encounter: Payer: Self-pay | Admitting: Internal Medicine

## 2016-12-28 VITALS — BP 110/84 | HR 87 | Temp 98.2°F | Ht 76.0 in | Wt 253.0 lb

## 2016-12-28 DIAGNOSIS — M5412 Radiculopathy, cervical region: Secondary | ICD-10-CM | POA: Diagnosis not present

## 2016-12-28 MED ORDER — OXYCODONE-ACETAMINOPHEN 10-325 MG PO TABS: 1.0000 | ORAL_TABLET | ORAL | 0 refills | Status: DC | PRN

## 2016-12-28 NOTE — Progress Notes (Signed)
   Subjective:    Patient ID: Jerry Arroyo, male    DOB: 1965-10-27, 51 y.o.   MRN: [id number]  HPI 51 year old with right cervical radiculopathy. Had recent MRI of the C-spine. We are attempting get him an appointment with Dr. Arnoldo Morale, neurosurgeon. He has finished Medrol Dosepak. He will start another one. He is out of pain medication. He had to take Norco 10 mg. One half tablet of Norco 10 mg did not help pain. He has weakness in his right upper extremity particular with pushing down. He has numbness of the first 3 fingers. Still with pain in right parathoracic area. On MRI, issue seems to be at C4-C5 with mild to moderate right foraminal stenosis.    Review of Systems     Objective:   Physical Exam Decreased sensation right first 3 fingers. Decreased muscle strength with pushing forward and downward in right upper extremity. 1+ deep tendon reflexes.       Assessment & Plan:  Right cervical radiculopathy  Plan: Repeat Medrol Dosepak. Try oxycodone a pap 10/325 1/2-1 by mouth every 6 hours when necessary pain. Has Flexeril.

## 2016-12-28 NOTE — Patient Instructions (Addendum)
Change Norco to oxycodone 10/325 for pain. Continue Flexeril and repeat steroid dosepak.

## 2017-01-04 ENCOUNTER — Other Ambulatory Visit: Payer: Commercial Managed Care - PPO

## 2017-02-22 ENCOUNTER — Other Ambulatory Visit: Payer: 59 | Admitting: Internal Medicine

## 2017-02-23 ENCOUNTER — Encounter: Payer: 59 | Admitting: Internal Medicine

## 2017-03-29 ENCOUNTER — Other Ambulatory Visit: Payer: 59 | Admitting: Internal Medicine

## 2017-03-29 DIAGNOSIS — Z0000 Encounter for general adult medical examination without abnormal findings: Secondary | ICD-10-CM

## 2017-03-29 DIAGNOSIS — E785 Hyperlipidemia, unspecified: Secondary | ICD-10-CM

## 2017-03-29 DIAGNOSIS — Z125 Encounter for screening for malignant neoplasm of prostate: Secondary | ICD-10-CM

## 2017-03-29 DIAGNOSIS — E119 Type 2 diabetes mellitus without complications: Secondary | ICD-10-CM

## 2017-03-29 LAB — COMPLETE METABOLIC PANEL WITH GFR
ALT: 31 U/L (ref 9–46)
AST: 21 U/L (ref 10–35)
Albumin: 4.4 g/dL (ref 3.6–5.1)
Alkaline Phosphatase: 56 U/L (ref 40–115)
BUN: 14 mg/dL (ref 7–25)
CHLORIDE: 103 mmol/L (ref 98–110)
CO2: 21 mmol/L (ref 20–31)
CREATININE: 0.75 mg/dL (ref 0.70–1.33)
Calcium: 9.2 mg/dL (ref 8.6–10.3)
GFR, Est African American: >89 mL/min (ref >=60)
GFR, Est Non African American: >89 mL/min (ref >=60)
Glucose, Bld: 122 mg/dL — ABNORMAL HIGH (ref 65–99)
Potassium: 4.2 mmol/L (ref 3.5–5.3)
SODIUM: 139 mmol/L (ref 135–146)
Total Bilirubin: 0.5 mg/dL (ref 0.2–1.2)
Total Protein: 7.1 g/dL (ref 6.1–8.1)

## 2017-03-29 LAB — CBC WITH DIFFERENTIAL/PLATELET
BASOS PCT: 0 %
Basophils Absolute: 0 cells/uL (ref 0–200)
Eosinophils Absolute: 171 cells/uL (ref 15–500)
Eosinophils Relative: 3 %
HCT: 43.9 % (ref 38.5–50.0)
Hemoglobin: 14.6 g/dL (ref 13.2–17.1)
LYMPHS PCT: 38 %
Lymphs Abs: 2166 cells/uL (ref 850–3900)
MCH: 29.6 pg (ref 27.0–33.0)
MCHC: 33.3 g/dL (ref 32.0–36.0)
MCV: 88.9 fL (ref 80.0–100.0)
MONO ABS: 456 {cells}/uL (ref 200–950)
MONOS PCT: 8 %
MPV: 9.5 fL (ref 7.5–12.5)
Neutro Abs: 2907 cells/uL (ref 1500–7800)
Neutrophils Relative %: 51 %
PLATELETS: 252 10*3/uL (ref 140–400)
RBC: 4.94 MIL/uL (ref 4.20–5.80)
RDW: 14 % (ref 11.0–15.0)
WBC: 5.7 10*3/uL (ref 3.8–10.8)

## 2017-03-29 LAB — LIPID PANEL
CHOL/HDL RATIO: 5.6 ratio — AB (ref <5.0)
Cholesterol: 200 mg/dL — ABNORMAL HIGH (ref <200)
HDL: 36 mg/dL — AB (ref >40)
LDL CALC: 109 mg/dL — AB (ref <100)
Triglycerides: 273 mg/dL — ABNORMAL HIGH (ref <150)
VLDL: 55 mg/dL — ABNORMAL HIGH (ref <30)

## 2017-03-30 LAB — MICROALBUMIN / CREATININE URINE RATIO
CREATININE, URINE: 213 mg/dL (ref 20–370)
Microalb Creat Ratio: 5 mcg/mg creat (ref <30)
Microalb, Ur: 1.1 mg/dL

## 2017-03-30 LAB — PSA: PSA: 0.9 ng/mL (ref <=4.0)

## 2017-03-30 LAB — HEMOGLOBIN A1C
Hgb A1c MFr Bld: 6.3 % — ABNORMAL HIGH (ref <5.7)
Mean Plasma Glucose: 134 mg/dL

## 2017-04-02 ENCOUNTER — Encounter: Payer: Self-pay | Admitting: Internal Medicine

## 2017-04-02 ENCOUNTER — Ambulatory Visit (INDEPENDENT_AMBULATORY_CARE_PROVIDER_SITE_OTHER): Payer: 59 | Admitting: Internal Medicine

## 2017-04-02 VITALS — BP 120/80 | HR 88 | Temp 98.2°F | Ht 73.0 in | Wt 247.0 lb

## 2017-04-02 DIAGNOSIS — E119 Type 2 diabetes mellitus without complications: Secondary | ICD-10-CM

## 2017-04-02 DIAGNOSIS — M5090 Cervical disc disorder, unspecified, unspecified cervical region: Secondary | ICD-10-CM | POA: Insufficient documentation

## 2017-04-02 DIAGNOSIS — Z87442 Personal history of urinary calculi: Secondary | ICD-10-CM

## 2017-04-02 DIAGNOSIS — Z6832 Body mass index (BMI) 32.0-32.9, adult: Secondary | ICD-10-CM

## 2017-04-02 DIAGNOSIS — H8109 Meniere's disease, unspecified ear: Secondary | ICD-10-CM | POA: Diagnosis not present

## 2017-04-02 DIAGNOSIS — Z0000 Encounter for general adult medical examination without abnormal findings: Secondary | ICD-10-CM

## 2017-04-02 DIAGNOSIS — E782 Mixed hyperlipidemia: Secondary | ICD-10-CM

## 2017-04-02 NOTE — Progress Notes (Signed)
Subjective:    Patient ID: Jerry Arroyo, male    DOB: April 07, 1966, 51 y.o.   MRN: [id number]  HPI  51 year old  Male for health maintenance exam and evaluation of medical issues.  Recently underwent surgery by Dr. Arnoldo Morale for right cervical radiculopathy and disc at C6-C7. He is recovering well but still has some numbness in his second third and fourth fingers of right hand. He is still on leave from work.  Situational stress in that daughter had anterior cruciate ligament repair recently after injuring herself in a baseball game. Father is being put at memory care at [facility]. Mother will be living independently in an apartment there. Her health is poor.  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  He has a history of diabetes mellitus and hyperlipidemia.  History of Mnire's disease treated by Dr. Idelle Crouch with HCTZ but patient has not been taking this regularly.  Aside from lawn service, doesn't get a lot of regular exercise. In November 2014 he was diagnosed with diabetes mellitus. Hemoglobin was 7.2% and he did not follow-up. Hemoglobin A1c in April 2016 was 10%.  He has a history of calcium oxalate kidney stones. He is followed by urologist and maintained on potassium citrate for that. He had left lithotripsy 2007.  Pneumonia vaccines up-to-date. Had tetanus immunization April 2016.  Left inguinal hernia repair 1983. Right inguinal hernia repair 1998. Peptic ulcer disease at age 13.  Right lateral epicondylitis 2005.  In 1999, he had a positive hepatitis B titer. MMR vaccine done 10/13/2008. History of positive varicella-zoster titer for nursing school June 2010. Hepatitis B series 06/17/1991, 07/24/1991, 02/03/1992.  Social history: He is married. 2 daughters. Wife is a Pharmacist, hospital. He completed nursing school at [employer]. 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   Family history: Father with dementia. Daughters in good health.            Review of Systems  Respiratory: Negative.   Cardiovascular: Negative.   Gastrointestinal: Negative.   Genitourinary: Negative.   Neurological:       Numbness in right second third and fourth finger status post cervical disc surgery       Objective:   Physical Exam  Constitutional: He is oriented to person, place, and time. He appears well-developed and well-nourished. No distress.  HENT:  Head: Normocephalic and atraumatic.  Right Ear: External ear normal.  Left Ear: External ear normal.  Mouth/Throat: Oropharynx is clear and moist. No oropharyngeal exudate.  Eyes: Pupils are equal, round, and reactive to light. Conjunctivae and EOM are normal. Right eye exhibits no discharge. Left eye exhibits no discharge. No scleral icterus.  Neck: Neck supple. No JVD present. No thyromegaly present.  Cardiovascular: Normal rate, regular rhythm, normal heart sounds and intact distal pulses.   No murmur heard. Pulmonary/Chest: Effort normal and breath sounds normal. He has no wheezes. He has no rales.  Abdominal: Soft. Bowel sounds are normal. He exhibits no distension and no mass. There is no tenderness. There is no rebound and no guarding.  Genitourinary: Prostate normal.  Musculoskeletal: He exhibits no edema.  Neurological: He is alert and oriented to person, place, and time. He has normal reflexes. No cranial nerve deficit. Coordination normal.  Skin:  Skin is warm and dry. No rash noted. He is not diaphoretic.  Psychiatric: He has a normal mood and affect. His behavior is normal. Judgment and thought content normal.  Vitals reviewed.         Assessment & Plan:  Neuropathy status post cervical disc surgery recently by Dr. Arnoldo Morale. Patient says neurosurgeon expects this to resolve  in the near future. He had herniated disc at C6-C7  Diabetes mellitus-A1c has increased recently due to inactivity status post surgery. He'll try to improve diet exercise and lose a bit of weight.-Repeat in 6 months.   Hyperlipidemia -Triglycerides are elevated at 273 and 7 months ago were 218. Total cholesterol is 200 and previously was 167. LDL cholesterol has increased to 109 and previously was 85.-We are attributing this also to inactivity. We'll repeat this in 6 months.  History of kidney stones  Situational stress with family particular mother and father who are at [facility] and father is entering memory care.  Continue diet and exercise efforts. Try to lose some weight return in 6 months. Recommend colonoscopy.

## 2017-04-02 NOTE — Patient Instructions (Addendum)
Continue to work on diet exercise and weight loss and return in 6 months. To have colonoscopy in the near future.

## 2017-04-03 LAB — POCT URINALYSIS DIPSTICK
Bilirubin, UA: NEGATIVE
Glucose, UA: NEGATIVE
Ketones, UA: NEGATIVE
Leukocytes, UA: NEGATIVE
NITRITE UA: NEGATIVE
PH UA: 6.5 (ref 5.0–8.0)
PROTEIN UA: NEGATIVE
RBC UA: NEGATIVE
Spec Grav, UA: 1.015 (ref 1.010–1.025)
UROBILINOGEN UA: 0.2 U/dL

## 2017-04-28 ENCOUNTER — Other Ambulatory Visit: Payer: Self-pay | Admitting: Internal Medicine

## 2017-09-13 ENCOUNTER — Other Ambulatory Visit: Payer: Self-pay | Admitting: Internal Medicine

## 2017-09-13 DIAGNOSIS — E785 Hyperlipidemia, unspecified: Secondary | ICD-10-CM

## 2017-09-13 DIAGNOSIS — E119 Type 2 diabetes mellitus without complications: Secondary | ICD-10-CM

## 2017-09-13 DIAGNOSIS — Z79899 Other long term (current) drug therapy: Secondary | ICD-10-CM

## 2017-10-02 ENCOUNTER — Other Ambulatory Visit: Payer: 59 | Admitting: Internal Medicine

## 2017-10-02 DIAGNOSIS — E119 Type 2 diabetes mellitus without complications: Secondary | ICD-10-CM

## 2017-10-02 DIAGNOSIS — Z79899 Other long term (current) drug therapy: Secondary | ICD-10-CM

## 2017-10-02 DIAGNOSIS — E785 Hyperlipidemia, unspecified: Secondary | ICD-10-CM

## 2017-10-03 LAB — LIPID PANEL
CHOL/HDL RATIO: 4.7 (calc) (ref <5.0)
Cholesterol: 166 mg/dL (ref <200)
HDL: 35 mg/dL — AB (ref >40)
LDL Cholesterol (Calc): 103 mg/dL (calc) — ABNORMAL HIGH
NON-HDL CHOLESTEROL (CALC): 131 mg/dL — AB (ref <130)
TRIGLYCERIDES: 161 mg/dL — AB (ref <150)

## 2017-10-03 LAB — HEMOGLOBIN A1C
EAG (MMOL/L): 8.9 (calc)
HEMOGLOBIN A1C: 7.2 %{Hb} — AB (ref <5.7)
MEAN PLASMA GLUCOSE: 160 (calc)

## 2017-10-03 LAB — HEPATIC FUNCTION PANEL
AG Ratio: 1.6 (calc) (ref 1.0–2.5)
ALT: 34 U/L (ref 9–46)
AST: 22 U/L (ref 10–35)
Albumin: 4.5 g/dL (ref 3.6–5.1)
Alkaline phosphatase (APISO): 47 U/L (ref 40–115)
BILIRUBIN DIRECT: 0.1 mg/dL (ref 0.0–0.2)
BILIRUBIN INDIRECT: 0.3 mg/dL (ref 0.2–1.2)
GLOBULIN: 2.9 g/dL (ref 1.9–3.7)
Total Bilirubin: 0.4 mg/dL (ref 0.2–1.2)
Total Protein: 7.4 g/dL (ref 6.1–8.1)

## 2017-10-03 LAB — MICROALBUMIN / CREATININE URINE RATIO
Creatinine, Urine: 157 mg/dL (ref 20–320)
MICROALB/CREAT RATIO: 4 ug/mg{creat} (ref <30)
Microalb, Ur: 0.7 mg/dL

## 2017-10-05 ENCOUNTER — Encounter: Payer: Self-pay | Admitting: Internal Medicine

## 2017-10-05 ENCOUNTER — Ambulatory Visit (INDEPENDENT_AMBULATORY_CARE_PROVIDER_SITE_OTHER): Payer: 59 | Admitting: Internal Medicine

## 2017-10-05 VITALS — BP 120/82 | HR 78 | Ht 73.0 in | Wt 255.0 lb

## 2017-10-05 DIAGNOSIS — E119 Type 2 diabetes mellitus without complications: Secondary | ICD-10-CM

## 2017-10-05 DIAGNOSIS — H8109 Meniere's disease, unspecified ear: Secondary | ICD-10-CM | POA: Diagnosis not present

## 2017-10-05 DIAGNOSIS — E782 Mixed hyperlipidemia: Secondary | ICD-10-CM

## 2017-10-05 DIAGNOSIS — Z6833 Body mass index (BMI) 33.0-33.9, adult: Secondary | ICD-10-CM

## 2017-10-05 MED ORDER — LEVOFLOXACIN 500 MG PO TABS: 500.0000 mg | ORAL_TABLET | Freq: Every day | ORAL | 0 refills | Status: DC

## 2017-10-05 NOTE — Patient Instructions (Signed)
Levaquin 500 mg daily for 10 days for maxillary sinusitis.  Test strips refilled.  Work on diet exercise and weight loss.  Continue same medications and return in 6 months.

## 2017-10-05 NOTE — Progress Notes (Signed)
   Subjective:    Patient ID: Jerry Arroyo, male    DOB: Mar 17, 1966, 52 y.o.   MRN: [id number]  HPI Father has Alzheimer's disease and is in memory care tablets would.  Not doing all that well.  This is stressful.  Patient is come down with a respiratory infection.  His daughter had similar issues.  He has maxillary sinus pressure.  He has diabetes mellitus.  He has hyperlipidemia.  Has not been eating as well recently with the holidays etc.  He works from home with [employer].  He is a Marine scientist.  He has a history of Mnire's disease.  BMI has increased to 33.64.  Not exercising as much.  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  His lipid panel has improved considerably.  Triglycerides have come down from 273-161 and total cholesterol from 200-166.  LDL was 109 and is now 103.  However, hemoglobin A1c has 3% to 7.2%.  He knows he needs to do better with diet and exercise.      Review of Systems see above-no fever or shaking chills     Objective:   Physical Exam  Skin warm and dry.  Nodes none.  TMs are clear.  Pharynx is clear.  He sounds nasally congested.  Neck is supple.  Chest clear.      Assessment & Plan:  Acute maxillary sinusitis  Controlled type 2 diabetes mellitus  Hyperlipidemia  BMI 33  History of Mnire's disease  Situational stress  Plan: Levaquin 500 mg daily for 10 days.  Work on diet exercise and weight loss.  Physical exam due in 6 months.  Continue same medications.

## 2017-10-08 ENCOUNTER — Other Ambulatory Visit: Payer: Self-pay | Admitting: Internal Medicine

## 2017-10-15 ENCOUNTER — Other Ambulatory Visit: Payer: Self-pay | Admitting: Internal Medicine

## 2018-01-15 ENCOUNTER — Ambulatory Visit (INDEPENDENT_AMBULATORY_CARE_PROVIDER_SITE_OTHER): Payer: 59 | Admitting: Internal Medicine

## 2018-01-15 VITALS — BP 110/64 | HR 74 | Temp 98.2°F | Ht 73.0 in | Wt 253.0 lb

## 2018-01-15 DIAGNOSIS — W57XXXA Bitten or stung by nonvenomous insect and other nonvenomous arthropods, initial encounter: Secondary | ICD-10-CM

## 2018-01-15 DIAGNOSIS — B029 Zoster without complications: Secondary | ICD-10-CM | POA: Diagnosis not present

## 2018-01-15 DIAGNOSIS — S30860A Insect bite (nonvenomous) of lower back and pelvis, initial encounter: Secondary | ICD-10-CM

## 2018-01-15 MED ORDER — TRIAMCINOLONE ACETONIDE 0.1 % EX CREA: TOPICAL_CREAM | CUTANEOUS | 0 refills | Status: DC

## 2018-01-15 MED ORDER — HYDROCODONE-ACETAMINOPHEN 10-325 MG PO TABS: 1.0000 | ORAL_TABLET | Freq: Three times a day (TID) | ORAL | 0 refills | Status: DC | PRN

## 2018-01-15 MED ORDER — VALACYCLOVIR HCL 1 G PO TABS: 1000.0000 mg | ORAL_TABLET | Freq: Three times a day (TID) | ORAL | 0 refills | Status: DC

## 2018-01-15 NOTE — Progress Notes (Signed)
   Subjective:    Patient ID: Jerry Arroyo, male    DOB: 06/14/1966, 52 y.o.   MRN: [id number]  HPI 52 year old Male with diabetes had onset of itching right trunk about 10 days ago.  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  Then began to feel some burning from below right scapula into axillary line and around to the right breast area.  Says it is not in the right infrascapular area.  Also found small tick right buttock and that bite area is very itchy and inflamed.  Under a lot of stress with new computer at work    Review of Systems see above    Objective:   Physical Exam He has lesions consistent with herpes zoster right trunk just below right scapula extending to right breast area.  Has a tick bite right buttock with surrounding erythematous areas but does not appear to be cellulitis.       Assessment & Plan:  Herpes zoster right trunk  Tick bite right buttock  Plan: It has been several days since onset of rash.  It may be a bit late for Valtrex from going to go ahead and start him on it 1 g 3 times a day for 7 days.  For tick bite have prescribed triamcinolone cream 0.1% to use on Bydureon 3 times a day.  Antibiotics were not prescribed.  Also for pain Norco 10/325 sparingly every 8 hours as needed pain.  Apply calamine lotion to zoster lesions.  Call if pain does not resolve in 2 to 4 weeks.

## 2018-01-15 NOTE — Patient Instructions (Addendum)
Valtrex 1 g 3 times a day for 7 days.  Take Norco sparingly every 8 hours for pain.  Apply calamine lotion to Zoster lesions.  Triamcinolone cream 3 times a day to tick bite right buttock.

## 2018-01-21 ENCOUNTER — Telehealth: Payer: Self-pay

## 2018-01-21 MED ORDER — GABAPENTIN 300 MG PO CAPS: 300.0000 mg | ORAL_CAPSULE | Freq: Three times a day (TID) | ORAL | 0 refills | Status: DC

## 2018-01-21 NOTE — Telephone Encounter (Signed)
Call in Gabapentin 300 mg 3 times a day #90

## 2018-01-21 NOTE — Telephone Encounter (Signed)
Patient called states the pain from the shingles has not resolved he said during his ov last week he was told to call back if not better and that you would send in gabapentin for him.

## 2018-01-21 NOTE — Telephone Encounter (Signed)
E-SCRIBED, LEFT VOICEMAIL LETTING PATIENT KNOW.

## 2018-01-24 ENCOUNTER — Telehealth: Payer: Self-pay | Admitting: Internal Medicine

## 2018-01-24 MED ORDER — HYDROCODONE-ACETAMINOPHEN 10-325 MG PO TABS: 1.0000 | ORAL_TABLET | Freq: Three times a day (TID) | ORAL | 0 refills | Status: DC | PRN

## 2018-01-24 NOTE — Telephone Encounter (Signed)
Left detailed message letting him know of Dr. Verlene Mayer instructions and to pick up paper prescription at the office this afternoon.

## 2018-01-24 NOTE — Telephone Encounter (Signed)
Tlaloc Taddei Self [phone]  [name] called to say that the HYDROcodone-acetaminophen [facility]) 10-325 MG tablet Really worked well for the pain with the shingles, and would like to get a refill. He stated that he had tried the gabapentin, but it just does not work for the pain.

## 2018-01-24 NOTE — Telephone Encounter (Signed)
We can only refill once due to opioid regulations.Please refill and call him to pick up. These symptoms will resolve with time.

## 2018-02-17 ENCOUNTER — Other Ambulatory Visit: Payer: Self-pay | Admitting: Internal Medicine

## 2018-03-27 ENCOUNTER — Ambulatory Visit: Payer: 59 | Admitting: Podiatry

## 2018-03-27 ENCOUNTER — Encounter

## 2018-04-03 ENCOUNTER — Other Ambulatory Visit: Payer: Self-pay | Admitting: Internal Medicine

## 2018-04-03 DIAGNOSIS — Z87442 Personal history of urinary calculi: Secondary | ICD-10-CM

## 2018-04-03 DIAGNOSIS — E119 Type 2 diabetes mellitus without complications: Secondary | ICD-10-CM

## 2018-04-03 DIAGNOSIS — H8109 Meniere's disease, unspecified ear: Secondary | ICD-10-CM

## 2018-04-03 DIAGNOSIS — M5090 Cervical disc disorder, unspecified, unspecified cervical region: Secondary | ICD-10-CM

## 2018-04-03 DIAGNOSIS — E782 Mixed hyperlipidemia: Secondary | ICD-10-CM

## 2018-04-03 DIAGNOSIS — Z0000 Encounter for general adult medical examination without abnormal findings: Secondary | ICD-10-CM

## 2018-04-05 ENCOUNTER — Other Ambulatory Visit: Payer: 59 | Admitting: Internal Medicine

## 2018-04-05 DIAGNOSIS — H8109 Meniere's disease, unspecified ear: Secondary | ICD-10-CM

## 2018-04-05 DIAGNOSIS — Z87442 Personal history of urinary calculi: Secondary | ICD-10-CM

## 2018-04-05 DIAGNOSIS — Z0000 Encounter for general adult medical examination without abnormal findings: Secondary | ICD-10-CM

## 2018-04-05 DIAGNOSIS — E782 Mixed hyperlipidemia: Secondary | ICD-10-CM

## 2018-04-05 DIAGNOSIS — M5090 Cervical disc disorder, unspecified, unspecified cervical region: Secondary | ICD-10-CM

## 2018-04-05 DIAGNOSIS — E119 Type 2 diabetes mellitus without complications: Secondary | ICD-10-CM

## 2018-04-06 LAB — CBC WITH DIFFERENTIAL/PLATELET
BASOS ABS: 48 {cells}/uL (ref 0–200)
BASOS PCT: 0.7 %
EOS ABS: 102 {cells}/uL (ref 15–500)
Eosinophils Relative: 1.5 %
HEMATOCRIT: 43.3 % (ref 38.5–50.0)
Hemoglobin: 15.5 g/dL (ref 13.2–17.1)
Lymphs Abs: 2196 cells/uL (ref 850–3900)
MCH: 31.4 pg (ref 27.0–33.0)
MCHC: 35.8 g/dL (ref 32.0–36.0)
MCV: 87.7 fL (ref 80.0–100.0)
MONOS PCT: 10.6 %
MPV: 10.3 fL (ref 7.5–12.5)
NEUTROS ABS: 3733 {cells}/uL (ref 1500–7800)
Neutrophils Relative %: 54.9 %
Platelets: 252 10*3/uL (ref 140–400)
RBC: 4.94 10*6/uL (ref 4.20–5.80)
RDW: 13.6 % (ref 11.0–15.0)
Total Lymphocyte: 32.3 %
WBC: 6.8 10*3/uL (ref 3.8–10.8)
WBCMIX: 721 {cells}/uL (ref 200–950)

## 2018-04-06 LAB — COMPLETE METABOLIC PANEL WITH GFR
AG Ratio: 1.7 (calc) (ref 1.0–2.5)
ALT: 35 U/L (ref 9–46)
AST: 27 U/L (ref 10–35)
Albumin: 4.5 g/dL (ref 3.6–5.1)
Alkaline phosphatase (APISO): 50 U/L (ref 40–115)
BUN: 10 mg/dL (ref 7–25)
CO2: 26 mmol/L (ref 20–32)
Calcium: 9.4 mg/dL (ref 8.6–10.3)
Chloride: 104 mmol/L (ref 98–110)
Creat: 0.75 mg/dL (ref 0.70–1.33)
GFR, EST AFRICAN AMERICAN: 121 mL/min/{1.73_m2} (ref >=60)
GFR, EST NON AFRICAN AMERICAN: 105 mL/min/{1.73_m2} (ref >=60)
Globulin: 2.7 g/dL (calc) (ref 1.9–3.7)
Glucose, Bld: 140 mg/dL — ABNORMAL HIGH (ref 65–99)
POTASSIUM: 4.4 mmol/L (ref 3.5–5.3)
Sodium: 139 mmol/L (ref 135–146)
TOTAL PROTEIN: 7.2 g/dL (ref 6.1–8.1)
Total Bilirubin: 0.5 mg/dL (ref 0.2–1.2)

## 2018-04-06 LAB — HEMOGLOBIN A1C
Hgb A1c MFr Bld: 7.2 % of total Hgb — ABNORMAL HIGH (ref <5.7)
MEAN PLASMA GLUCOSE: 160 (calc)
eAG (mmol/L): 8.9 (calc)

## 2018-04-06 LAB — LIPID PANEL
Cholesterol: 197 mg/dL (ref <200)
HDL: 46 mg/dL (ref >40)
LDL Cholesterol (Calc): 121 mg/dL (calc) — ABNORMAL HIGH
Non-HDL Cholesterol (Calc): 151 mg/dL (calc) — ABNORMAL HIGH (ref <130)
Total CHOL/HDL Ratio: 4.3 (calc) (ref <5.0)
Triglycerides: 187 mg/dL — ABNORMAL HIGH (ref <150)

## 2018-04-06 LAB — MICROALBUMIN / CREATININE URINE RATIO
CREATININE, URINE: 108 mg/dL (ref 20–320)
MICROALB UR: 0.8 mg/dL
MICROALB/CREAT RATIO: 7 ug/mg{creat} (ref <30)

## 2018-04-06 LAB — PSA: PSA: 0.5 ng/mL (ref <=4.0)

## 2018-04-09 ENCOUNTER — Ambulatory Visit (INDEPENDENT_AMBULATORY_CARE_PROVIDER_SITE_OTHER): Payer: 59 | Admitting: Internal Medicine

## 2018-04-09 ENCOUNTER — Encounter: Payer: Self-pay | Admitting: Internal Medicine

## 2018-04-09 VITALS — BP 108/80 | HR 76 | Ht 73.0 in | Wt 253.0 lb

## 2018-04-09 DIAGNOSIS — E782 Mixed hyperlipidemia: Secondary | ICD-10-CM | POA: Diagnosis not present

## 2018-04-09 DIAGNOSIS — Z6833 Body mass index (BMI) 33.0-33.9, adult: Secondary | ICD-10-CM

## 2018-04-09 DIAGNOSIS — Z0000 Encounter for general adult medical examination without abnormal findings: Secondary | ICD-10-CM | POA: Diagnosis not present

## 2018-04-09 DIAGNOSIS — E119 Type 2 diabetes mellitus without complications: Secondary | ICD-10-CM

## 2018-04-09 DIAGNOSIS — Z87442 Personal history of urinary calculi: Secondary | ICD-10-CM | POA: Diagnosis not present

## 2018-04-09 DIAGNOSIS — H8109 Meniere's disease, unspecified ear: Secondary | ICD-10-CM

## 2018-04-09 DIAGNOSIS — Z1211 Encounter for screening for malignant neoplasm of colon: Secondary | ICD-10-CM

## 2018-04-09 LAB — HEMOCCULT GUIAC POC 1CARD (OFFICE): Fecal Occult Blood, POC: NEGATIVE

## 2018-04-09 MED ORDER — ATORVASTATIN CALCIUM 20 MG PO TABS: 20.0000 mg | ORAL_TABLET | Freq: Every day | ORAL | 3 refills | Status: DC

## 2018-04-09 MED ORDER — METFORMIN HCL 500 MG PO TABS: 500.0000 mg | ORAL_TABLET | Freq: Two times a day (BID) | ORAL | 3 refills | Status: DC

## 2018-04-09 MED ORDER — LINAGLIPTIN 5 MG PO TABS: 5.0000 mg | ORAL_TABLET | Freq: Every day | ORAL | 3 refills | Status: DC

## 2018-04-09 NOTE — Progress Notes (Signed)
Subjective:    Patient ID: Jerry Arroyo, male    DOB: [birth year], 52 y.o.   MRN: [id number]  HPI 52 year old Male for health maintenance exam and evaluation of medical issues.Hx of DM and hyperlipidemia. History of Mnire's disease treated with HCTZ by Dr. Idelle Crouch at [facility].  Hemoglobin A1c 7.2%.  His weight has increased.  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  I would like to see his A1c better.  We could increase his metformin but he wants to diet and exercise.  He is on Tradjenta 5 mg .   In April 2016 , his A1c was 10%.  His A1c was 7.2% in November 2014.  He has a history of calcium oxalate kidney stones.  He is followed by urologist and is maintained on potassium citrate for that.  He had left lithotripsy in 2007.  Patient says after his rectal exam in April 2016, he came down with a severe case of prostatitis treated by urologist and had never had that previously.  Has had both Prevnar 13 and Pneumovax 23 immunizations.  Had tetanus immunization April 2016.  Left inguinal hernia repair 1983.  Right inguinal hernia repair in 1998.  History of peptic ulcer disease at age 83.  His triglycerides have increased and we are going to increase Lipitor to 20 mg from 10 mg daily and follow-up in 6 months.  Family Hx: Father with dementia in memory care. Mother with heart issues.  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  Married.  Wife is an Chief Technology Officer.  2 daughters.  Declines colonoscopy due to expense.  Hemoccult card x1 today is negative.  Given information on Cologuard.  Recent bout of Herpes zoster and had some residual postherpetic neuropathy.  Still occasionally takes gabapentin if uncomfortable.   Review of Systems  HENT: Negative.   Respiratory: Negative.   Cardiovascular: Negative.   Gastrointestinal: Negative.   Genitourinary: Negative.   Neurological: Negative.     Psychiatric/Behavioral: Negative.        Objective:   Physical Exam  Constitutional: He is oriented to person, place, and time. He appears well-developed and well-nourished. No distress.  HENT:  Head: Normocephalic and atraumatic.  Right Ear: External ear normal.  Left Ear: External ear normal.  Nose: Nose normal.  Mouth/Throat: Oropharynx is clear and moist. No oropharyngeal exudate.  Eyes: Pupils are equal, round, and reactive to light. Conjunctivae and EOM are normal. Right eye exhibits no discharge. Left eye exhibits no discharge.  Neck: Neck supple. No JVD present. No thyromegaly present.  Cardiovascular: Normal rate, regular rhythm and normal heart sounds. Exam reveals no gallop and no friction rub.  No murmur heard. Pulmonary/Chest: Effort normal and breath sounds normal. No stridor. No respiratory distress. He has no wheezes. He has no rales.  Abdominal: Soft. He exhibits no distension and no mass. There is no tenderness. There is no rebound and no guarding.  Genitourinary: Prostate normal.  Musculoskeletal: He exhibits no edema.  Lymphadenopathy:    He has no cervical adenopathy.  Neurological: He is alert and oriented to person, place, and time. He displays normal reflexes. No cranial nerve deficit or sensory deficit. He exhibits normal muscle tone. Coordination normal.  Skin: Skin is warm and dry. He is not diaphoretic.  Psychiatric: He has a normal mood and affect. His behavior is normal. Judgment and thought content normal.  Assessment & Plan:  Normal health maintenance exam-reminded about annual diabetic eye exam.  Declines colonoscopy due to expense.  Information on Cologuard given.  Hemoccult card x1-.  Immunizations up-to-date.  Controlled type 2 diabetes mellitus but would like to see hemoglobin A1c a bit better.  BMI 33-needs to lose weight  Hypertriglyceridemia-Lipitor increased to 20 mg daily and follow-up in 6 months  History of kidney  stones-none recently  History of Mnire's disease treated with diuretic  Plan: Follow-up in 6 months

## 2018-04-09 NOTE — Addendum Note (Signed)
Addended by: Mady Haagensen on: 04/09/2018 05:03 PM   Modules accepted: Orders

## 2018-04-09 NOTE — Patient Instructions (Signed)
It was a pleasure to see you today.  Please work on diet exercise and weight loss and follow-up in 6 months.  Have increased his Lipitor from 10 to 20 mg daily.  Please have diabetic eye exam.

## 2018-10-14 ENCOUNTER — Ambulatory Visit (INDEPENDENT_AMBULATORY_CARE_PROVIDER_SITE_OTHER): Payer: 59 | Admitting: Internal Medicine

## 2018-10-14 ENCOUNTER — Other Ambulatory Visit: Payer: 59 | Admitting: Internal Medicine

## 2018-10-14 ENCOUNTER — Encounter: Payer: Self-pay | Admitting: Internal Medicine

## 2018-10-14 VITALS — BP 122/80 | HR 78 | Temp 98.3°F | Ht 73.0 in | Wt 259.0 lb

## 2018-10-14 DIAGNOSIS — Z87442 Personal history of urinary calculi: Secondary | ICD-10-CM | POA: Diagnosis not present

## 2018-10-14 DIAGNOSIS — H8109 Meniere's disease, unspecified ear: Secondary | ICD-10-CM | POA: Diagnosis not present

## 2018-10-14 DIAGNOSIS — E782 Mixed hyperlipidemia: Secondary | ICD-10-CM | POA: Diagnosis not present

## 2018-10-14 DIAGNOSIS — E109 Type 1 diabetes mellitus without complications: Secondary | ICD-10-CM

## 2018-10-14 DIAGNOSIS — E119 Type 2 diabetes mellitus without complications: Secondary | ICD-10-CM

## 2018-10-14 NOTE — Progress Notes (Signed)
   Subjective:    Patient ID: Jerry Arroyo, male    DOB: 03/15/1966, 53 y.o.   MRN: [id number]  HPI 53 year old Male for 6 month recheck.  Lost father in November with hx of dementia but had sepsis from kidney stone blocking ureter.  Pt still working for [employer].There was a major lay off but he kept his current job and still able to work from home.  Has not been exercising. Has weight gain. Hx DM, kidney stones, Mnire's disease treated with HCTZ.  History of hyperlipidemia.  Has not had diabetic eye exam.  Has not had colonoscopy due to expense.  Immunizations are up-to-date including both pneumococcal vaccines and influenza vaccine.    Review of Systems  Respiratory: Negative.   Cardiovascular: Negative.   Gastrointestinal: Negative.   Genitourinary: Negative.   Psychiatric/Behavioral:       Sleeping okay after Father's death       Objective:   Physical Exam Vitals signs reviewed.  Constitutional:      General: He is not in acute distress.    Appearance: Normal appearance. He is obese. He is not ill-appearing.  HENT:     Head: Normocephalic and atraumatic.  Eyes:     General: No scleral icterus.       Right eye: No discharge.        Left eye: No discharge.  Neck:     Musculoskeletal: Neck supple.     Vascular: No carotid bruit.  Cardiovascular:     Rate and Rhythm: Normal rate and regular rhythm.     Heart sounds: Normal heart sounds. No murmur.  Pulmonary:     Effort: Pulmonary effort is normal.     Breath sounds: Normal breath sounds. No wheezing or rales.  Lymphadenopathy:     Cervical: No cervical adenopathy.  Skin:    General: Skin is warm and dry.  Neurological:     Mental Status: He is alert.  Psychiatric:        Mood and Affect: Mood normal.        Behavior: Behavior normal.        Thought Content: Thought content normal.        Judgment: Judgment normal.    Weight increased 6 pounds since August.       Assessment & Plan:  Type  2 diabetes mellitus-hemoglobin A1c pending along with urine microalbumin  Hyperlipidemia-lipid panel and liver functions pending on statin therapy  History of kidney stones  History of Mnire's disease  Plan: Reviewed labs and make further recommendations otherwise return in 6 months.  Eye exam and colonoscopy are care gaps that patient needs to consider.

## 2018-10-14 NOTE — Patient Instructions (Signed)
Sorry to hear about the loss of your father.  It was a pleasure to see you today.  Continue same medications.  Lab work to be reviewed.  Schedule physical exam in 6 months.

## 2018-10-15 LAB — HEPATIC FUNCTION PANEL
AG RATIO: 1.5 (calc) (ref 1.0–2.5)
ALBUMIN MSPROF: 4.4 g/dL (ref 3.6–5.1)
ALT: 23 U/L (ref 9–46)
AST: 17 U/L (ref 10–35)
Alkaline phosphatase (APISO): 52 U/L (ref 35–144)
Bilirubin, Direct: 0.1 mg/dL (ref 0.0–0.2)
GLOBULIN: 2.9 g/dL (ref 1.9–3.7)
Indirect Bilirubin: 0.4 mg/dL (calc) (ref 0.2–1.2)
TOTAL PROTEIN: 7.3 g/dL (ref 6.1–8.1)
Total Bilirubin: 0.5 mg/dL (ref 0.2–1.2)

## 2018-10-15 LAB — HEMOGLOBIN A1C
EAG (MMOL/L): 10.1 (calc)
Hgb A1c MFr Bld: 8 % of total Hgb — ABNORMAL HIGH (ref <5.7)
Mean Plasma Glucose: 183 (calc)

## 2018-10-15 LAB — MICROALBUMIN / CREATININE URINE RATIO
Creatinine, Urine: 181 mg/dL (ref 20–320)
MICROALB/CREAT RATIO: 5 ug/mg{creat} (ref <30)
Microalb, Ur: 0.9 mg/dL

## 2018-10-15 LAB — LIPID PANEL
CHOL/HDL RATIO: 4.7 (calc) (ref <5.0)
CHOLESTEROL: 184 mg/dL (ref <200)
HDL: 39 mg/dL — AB (ref >=40)
LDL CHOLESTEROL (CALC): 115 mg/dL — AB
NON-HDL CHOLESTEROL (CALC): 145 mg/dL — AB (ref <130)
TRIGLYCERIDES: 183 mg/dL — AB (ref <150)

## 2018-10-16 ENCOUNTER — Other Ambulatory Visit: Payer: Self-pay | Admitting: Internal Medicine

## 2018-10-16 DIAGNOSIS — E119 Type 2 diabetes mellitus without complications: Secondary | ICD-10-CM

## 2018-10-16 DIAGNOSIS — E781 Pure hyperglyceridemia: Secondary | ICD-10-CM

## 2018-11-05 ENCOUNTER — Ambulatory Visit (INDEPENDENT_AMBULATORY_CARE_PROVIDER_SITE_OTHER): Payer: 59 | Admitting: Internal Medicine

## 2018-11-05 ENCOUNTER — Encounter: Payer: Self-pay | Admitting: Internal Medicine

## 2018-11-05 VITALS — BP 136/70 | HR 75 | Ht 73.0 in | Wt 259.0 lb

## 2018-11-05 DIAGNOSIS — IMO0002 Reserved for concepts with insufficient information to code with codable children: Secondary | ICD-10-CM

## 2018-11-05 DIAGNOSIS — E1165 Type 2 diabetes mellitus with hyperglycemia: Secondary | ICD-10-CM

## 2018-11-05 DIAGNOSIS — E1142 Type 2 diabetes mellitus with diabetic polyneuropathy: Secondary | ICD-10-CM | POA: Diagnosis not present

## 2018-11-05 MED ORDER — GLUCOSE BLOOD VI STRP: ORAL_STRIP | 5 refills | Status: DC

## 2018-11-05 MED ORDER — METFORMIN HCL 1000 MG PO TABS: 1000.0000 mg | ORAL_TABLET | Freq: Two times a day (BID) | ORAL | 3 refills | Status: DC
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
## 2018-11-05 NOTE — Patient Instructions (Signed)
Please increase: - Metformin to 1000 2x a day with meals.  Continue: - Tradjenta 5 mg ~30 min before b'fast  Please return in 2 months with your sugar log.  PATIENT INSTRUCTIONS FOR TYPE 2 DIABETES:  **Please join MyChart!** - see attached instructions about how to join if you have not done so already.  DIET AND EXERCISE Diet and exercise is an important part of diabetic treatment.  We recommended aerobic exercise in the form of brisk walking (working between 40-60% of maximal aerobic capacity, similar to brisk walking) for 150 minutes per week (such as 30 minutes five days per week) along with 3 times per week performing 'resistance' training (using various gauge rubber tubes with handles) 5-10 exercises involving the major muscle groups (upper body, lower body and core) performing 10-15 repetitions (or near fatigue) each exercise. Start at half the above goal but build slowly to reach the above goals. If limited by weight, joint pain, or disability, we recommend daily walking in a swimming pool with water up to waist to reduce pressure from joints while allow for adequate exercise.    BLOOD GLUCOSES Monitoring your blood glucoses is important for continued management of your diabetes. Please check your blood glucoses 2-4 times a day: fasting, before meals and at bedtime (you can rotate these measurements - e.g. one day check before the 3 meals, the next day check before 2 of the meals and before bedtime, etc.).   HYPOGLYCEMIA (low blood sugar) Hypoglycemia is usually a reaction to not eating, exercising, or taking too much insulin/ other diabetes drugs.  Symptoms include tremors, sweating, hunger, confusion, headache, etc. Treat IMMEDIATELY with 15 grams of Carbs: . 4 glucose tablets .  cup regular juice/soda . 2 tablespoons raisins . 4 teaspoons sugar . 1 tablespoon honey Recheck blood glucose in 15 mins and repeat above if still symptomatic/blood glucose <100.  RECOMMENDATIONS TO  REDUCE YOUR RISK OF DIABETIC COMPLICATIONS: * Take your prescribed MEDICATION(S) * Follow a DIABETIC diet: Complex carbs, fiber rich foods, (monounsaturated and polyunsaturated) fats * AVOID saturated/trans fats, high fat foods, >2,300 mg salt per day. * EXERCISE at least 5 times a week for 30 minutes or preferably daily.  * DO NOT SMOKE OR DRINK more than 1 drink a day. * Check your FEET every day. Do not wear tightfitting shoes. Contact us if you develop an ulcer * See your EYE doctor once a year or more if needed * Get a FLU shot once a year * Get a PNEUMONIA vaccine once before and once after age 6 years  GOALS:  * Your Hemoglobin A1c of <7%  * fasting sugars need to be <130 * after meals sugars need to be <180 (2h after you start eating) * Your Systolic BP should be 798 or lower  * Your Diastolic BP should be 80 or lower  * Your HDL (Good Cholesterol) should be 40 or higher  * Your LDL (Bad Cholesterol) should be 100 or lower. * Your Triglycerides should be 150 or lower  * Your Urine microalbumin (kidney function) should be <30 * Your Body Mass Index should be 25 or lower    Please consider the following ways to cut down carbs and fat and increase fiber and micronutrients in your diet: - substitute whole grain for white bread or pasta - substitute brown rice for white rice - substitute 90-calorie flat bread pieces for slices of bread when possible - substitute sweet potatoes or yams for white potatoes - substitute  humus for margarine - substitute tofu for cheese when possible - substitute almond or rice milk for regular milk (would not drink soy milk daily due to concern for soy estrogen influence on breast cancer risk) - substitute dark chocolate for other sweets when possible - substitute water - can add lemon or orange slices for taste - for diet sodas (artificial sweeteners will trick your body that you can eat sweets without getting calories and will lead you to overeating  and weight gain in the long run) - do not skip breakfast or other meals (this will slow down the metabolism and will result in more weight gain over time)  - can try smoothies made from fruit and almond/rice milk in am instead of regular breakfast - can also try old-fashioned (not instant) oatmeal made with almond/rice milk in am - order the dressing on the side when eating salad at a restaurant (pour less than half of the dressing on the salad) - eat as little meat as possible - can try juicing, but should not forget that juicing will get rid of the fiber, so would alternate with eating raw veg./fruits or drinking smoothies - use as little oil as possible, even when using olive oil - can dress a salad with a mix of balsamic vinegar and lemon juice, for e.g. - use agave nectar, stevia sugar, or regular sugar rather than artificial sweateners - steam or broil/roast veggies  - snack on veggies/fruit/nuts (unsalted, preferably) when possible, rather than processed foods - reduce or eliminate aspartame in diet (it is in diet sodas, chewing gum, etc) Read the labels!  Try to read Dr. Janene Harvey book: "Program for Reversing Diabetes" for other ideas for healthy eating.

## 2018-11-13 ENCOUNTER — Telehealth: Payer: Self-pay

## 2018-11-13 NOTE — Telephone Encounter (Signed)
Error

## 2019-01-03 ENCOUNTER — Ambulatory Visit: Payer: 59 | Admitting: Internal Medicine

## 2019-04-14 ENCOUNTER — Other Ambulatory Visit: Payer: Self-pay

## 2019-04-14 ENCOUNTER — Encounter: Payer: Self-pay | Admitting: Internal Medicine

## 2019-04-14 ENCOUNTER — Ambulatory Visit (INDEPENDENT_AMBULATORY_CARE_PROVIDER_SITE_OTHER): Payer: 59 | Admitting: Internal Medicine

## 2019-04-14 VITALS — BP 110/80 | HR 78 | Temp 98.1°F | Ht 73.0 in | Wt 250.0 lb

## 2019-04-14 DIAGNOSIS — Z6832 Body mass index (BMI) 32.0-32.9, adult: Secondary | ICD-10-CM

## 2019-04-14 DIAGNOSIS — M5090 Cervical disc disorder, unspecified, unspecified cervical region: Secondary | ICD-10-CM

## 2019-04-14 DIAGNOSIS — Z87442 Personal history of urinary calculi: Secondary | ICD-10-CM

## 2019-04-14 DIAGNOSIS — E782 Mixed hyperlipidemia: Secondary | ICD-10-CM | POA: Diagnosis not present

## 2019-04-14 DIAGNOSIS — Z0000 Encounter for general adult medical examination without abnormal findings: Secondary | ICD-10-CM | POA: Diagnosis not present

## 2019-04-14 DIAGNOSIS — E119 Type 2 diabetes mellitus without complications: Secondary | ICD-10-CM

## 2019-04-14 DIAGNOSIS — H8109 Meniere's disease, unspecified ear: Secondary | ICD-10-CM | POA: Diagnosis not present

## 2019-04-14 LAB — POCT URINALYSIS DIPSTICK
Appearance: NEGATIVE
Bilirubin, UA: NEGATIVE
Blood, UA: NEGATIVE
Glucose, UA: NEGATIVE
Ketones, UA: NEGATIVE
Leukocytes, UA: NEGATIVE
Nitrite, UA: NEGATIVE
Odor: NEGATIVE
Protein, UA: NEGATIVE
Spec Grav, UA: 1.01 (ref 1.010–1.025)
Urobilinogen, UA: 0.2 E.U./dL
pH, UA: 6.5 (ref 5.0–8.0)

## 2019-04-14 NOTE — Patient Instructions (Signed)
See Dr. Jeffie Pollock for prostate exam and kidney stone management. Continue diet exercise and weight loss. Have flu vaccine in September. Referral to ENT regarding meniere's and hearing loss. Labs drawn and pending. RTC in 6 months.

## 2019-04-14 NOTE — Progress Notes (Signed)
   Subjective:    Patient ID: Jerry Arroyo, male    DOB: [birth year], 53 y.o.   MRN: [id number]  HPI 53 year old Male for health maintenance exam and evaluation of medical issues.  History of Mnire's disease diagnosed by Dr. Idelle Crouch at  [facility] and treated with HCTZ.  Has been more dizzy recently and would like to be evaluated ENT physician here.  History of diabetes mellitus.  Hemoglobin A1c 6.2% and previously was 8% in February.  Continue current medications, encourage diet and exercise.  History of calcium oxalate kidney stones.  He thinks that he passed one recently.  He is followed by urology and is on potassium citrate.  He had left lithotripsy 2007.  Tetanus immunization given April 2016.  He has had Prevnar 13 and Pneumovax 23 immunizations due to diabetes mellitus.  Family history: Father passed away with complications of dementia.  Mother still living at [facility] with heart issues.  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  He also has a yard service.  He is married.  Wife is an Chief Technology Officer.  2 daughters, one adult daughter who recently married and another daughter who is a Equities trader in high school.  He does not smoke or consume alcohol.  History of herpes zoster in May 2019.  Left inguinal hernia repair 1993, right inguinal hernia repair 1998.  History of peptic ulcer disease at age 83.  History of hypertriglyceridemia.  Maintained on statin therapy.       Review of Systems  Had hematuria and thinks he saw a stone in urine last week. Sees Dr. Jeffie Pollock annually. Takes potassium citrate. Urine dipstick was normal. Lost 9 pounds since February. Had left foot plantar fasciitiis- seen by Dr. Gershon Mussel  Hx Menier's disease has some vertigo and ringing in ears. Has seen Dr Idelle Crouch at [facility]. Pt complaining of hearing loss. Wants to see ENT.    Objective:   Physical Exam BP 110/80 BMI 32.98.  Weight 250 pounds.  Pulse 78 and  regular.  Temperature 98.1 degrees.  Skin warm and dry.  Nodes none.  Neck is supple without JVD thyromegaly or carotid bruits.  Chest clear to auscultation.  Cardiac exam regular rate and rhythm normal S1 and S2 without murmurs or gallops.  Abdomen no hepatosplenomegaly masses or tenderness.  Rectal exam is normal and prostate is without nodules.  No lower extremity edema.  No focal deficits on brief neurological exam.  No demonstrated nystagmus.  Cranial nerves II through XII are grossly intact.  Hearing appears to be grossly normal.       Assessment & Plan:  Hx Meniere's disease- has vertigo sometimes-wants to see local ENT physician.  Is on HCTZ  Ear ringing and hearing loss- ? Related to #1 wants hearing evaluation  Hx kidney stones passed one last week.  Will follow-up with urologist.  Urinalysis today by dipstick is normal  AODM-continue current treatment.  Hemoglobin A1c 6.2% and previously was 8% in February 2020.  BMI 32.98-needs to lose weight.  I think he needs to get more exercise and watch his diet further.  Hyperlipidemia- apparently has been off statin medication for 3 months.  Total cholesterol was 220, triglycerides 219 and LDL cholesterol 142.  He is to resume statin medication and follow-up in 8 weeks.  Reminded regarding annual flu vaccine.

## 2019-04-15 LAB — HEMOGLOBIN A1C
Hgb A1c MFr Bld: 6.2 % of total Hgb — ABNORMAL HIGH (ref <5.7)
Mean Plasma Glucose: 131 (calc)
eAG (mmol/L): 7.3 (calc)

## 2019-04-15 LAB — CBC WITH DIFFERENTIAL/PLATELET
Absolute Monocytes: 738 cells/uL (ref 200–950)
Basophils Absolute: 43 cells/uL (ref 0–200)
Basophils Relative: 0.6 %
Eosinophils Absolute: 121 cells/uL (ref 15–500)
Eosinophils Relative: 1.7 %
HCT: 43.2 % (ref 38.5–50.0)
Hemoglobin: 14.7 g/dL (ref 13.2–17.1)
Lymphs Abs: 2293 cells/uL (ref 850–3900)
MCH: 30.1 pg (ref 27.0–33.0)
MCHC: 34 g/dL (ref 32.0–36.0)
MCV: 88.5 fL (ref 80.0–100.0)
MPV: 10.1 fL (ref 7.5–12.5)
Monocytes Relative: 10.4 %
Neutro Abs: 3905 cells/uL (ref 1500–7800)
Neutrophils Relative %: 55 %
Platelets: 267 10*3/uL (ref 140–400)
RBC: 4.88 10*6/uL (ref 4.20–5.80)
RDW: 13.7 % (ref 11.0–15.0)
Total Lymphocyte: 32.3 %
WBC: 7.1 10*3/uL (ref 3.8–10.8)

## 2019-04-15 LAB — COMPLETE METABOLIC PANEL WITH GFR
AG Ratio: 1.6 (calc) (ref 1.0–2.5)
ALT: 24 U/L (ref 9–46)
AST: 19 U/L (ref 10–35)
Albumin: 4.6 g/dL (ref 3.6–5.1)
Alkaline phosphatase (APISO): 42 U/L (ref 35–144)
BUN: 14 mg/dL (ref 7–25)
CO2: 26 mmol/L (ref 20–32)
Calcium: 9.4 mg/dL (ref 8.6–10.3)
Chloride: 106 mmol/L (ref 98–110)
Creat: 0.76 mg/dL (ref 0.70–1.33)
GFR, Est African American: 120 mL/min/{1.73_m2} (ref >=60)
GFR, Est Non African American: 103 mL/min/{1.73_m2} (ref >=60)
Globulin: 2.9 g/dL (calc) (ref 1.9–3.7)
Glucose, Bld: 111 mg/dL — ABNORMAL HIGH (ref 65–99)
Potassium: 4.3 mmol/L (ref 3.5–5.3)
Sodium: 142 mmol/L (ref 135–146)
Total Bilirubin: 0.5 mg/dL (ref 0.2–1.2)
Total Protein: 7.5 g/dL (ref 6.1–8.1)

## 2019-04-15 LAB — LIPID PANEL
Cholesterol: 220 mg/dL — ABNORMAL HIGH (ref <200)
HDL: 42 mg/dL (ref >=40)
LDL Cholesterol (Calc): 142 mg/dL (calc) — ABNORMAL HIGH
Non-HDL Cholesterol (Calc): 178 mg/dL (calc) — ABNORMAL HIGH (ref <130)
Total CHOL/HDL Ratio: 5.2 (calc) — ABNORMAL HIGH (ref <5.0)
Triglycerides: 219 mg/dL — ABNORMAL HIGH (ref <150)

## 2019-04-15 LAB — PSA: PSA: 0.8 ng/mL (ref <=4.0)

## 2019-04-27 ENCOUNTER — Other Ambulatory Visit: Payer: Self-pay | Admitting: Internal Medicine

## 2019-05-04 ENCOUNTER — Other Ambulatory Visit: Payer: Self-pay | Admitting: Internal Medicine

## 2019-05-09 ENCOUNTER — Other Ambulatory Visit: Payer: Self-pay | Admitting: Internal Medicine

## 2019-05-16 ENCOUNTER — Ambulatory Visit: Payer: 59 | Admitting: Internal Medicine

## 2019-06-05 ENCOUNTER — Encounter: Payer: Self-pay | Admitting: Internal Medicine

## 2019-06-05 LAB — HM DIABETES EYE EXAM

## 2019-10-16 ENCOUNTER — Other Ambulatory Visit: Payer: Self-pay

## 2019-10-16 ENCOUNTER — Other Ambulatory Visit: Payer: 59 | Admitting: Internal Medicine

## 2019-10-16 DIAGNOSIS — E119 Type 2 diabetes mellitus without complications: Secondary | ICD-10-CM

## 2019-10-16 DIAGNOSIS — E782 Mixed hyperlipidemia: Secondary | ICD-10-CM

## 2019-10-17 ENCOUNTER — Ambulatory Visit (INDEPENDENT_AMBULATORY_CARE_PROVIDER_SITE_OTHER): Payer: 59 | Admitting: Internal Medicine

## 2019-10-17 VITALS — BP 120/80 | HR 97 | Temp 98.0°F | Ht 75.0 in | Wt 250.0 lb

## 2019-10-17 DIAGNOSIS — E1169 Type 2 diabetes mellitus with other specified complication: Secondary | ICD-10-CM

## 2019-10-17 DIAGNOSIS — Z87442 Personal history of urinary calculi: Secondary | ICD-10-CM | POA: Diagnosis not present

## 2019-10-17 DIAGNOSIS — H8109 Meniere's disease, unspecified ear: Secondary | ICD-10-CM

## 2019-10-17 DIAGNOSIS — E782 Mixed hyperlipidemia: Secondary | ICD-10-CM

## 2019-10-17 DIAGNOSIS — Z6831 Body mass index (BMI) 31.0-31.9, adult: Secondary | ICD-10-CM

## 2019-10-17 LAB — HEPATIC FUNCTION PANEL
AG Ratio: 1.6 (calc) (ref 1.0–2.5)
ALT: 22 U/L (ref 9–46)
AST: 16 U/L (ref 10–35)
Albumin: 4.5 g/dL (ref 3.6–5.1)
Alkaline phosphatase (APISO): 47 U/L (ref 35–144)
Bilirubin, Direct: 0.1 mg/dL (ref 0.0–0.2)
Globulin: 2.8 g/dL (calc) (ref 1.9–3.7)
Indirect Bilirubin: 0.4 mg/dL (calc) (ref 0.2–1.2)
Total Bilirubin: 0.5 mg/dL (ref 0.2–1.2)
Total Protein: 7.3 g/dL (ref 6.1–8.1)

## 2019-10-17 LAB — LIPID PANEL
Cholesterol: 156 mg/dL (ref <200)
HDL: 40 mg/dL (ref >=40)
LDL Cholesterol (Calc): 83 mg/dL (calc)
Non-HDL Cholesterol (Calc): 116 mg/dL (calc) (ref <130)
Total CHOL/HDL Ratio: 3.9 (calc) (ref <5.0)
Triglycerides: 240 mg/dL — ABNORMAL HIGH (ref <150)

## 2019-10-17 LAB — HEMOGLOBIN A1C
Hgb A1c MFr Bld: 6.7 % of total Hgb — ABNORMAL HIGH (ref <5.7)
Mean Plasma Glucose: 146 (calc)
eAG (mmol/L): 8.1 (calc)

## 2019-10-17 NOTE — Progress Notes (Signed)
   Subjective:    Patient ID: Jerry Arroyo, male    DOB: 11-19-1965, 54 y.o.   MRN: [id number]  HPI 54 year old Male for 6 month follow up on hyperlipidemia and DM. Has had both COVID-19 vaccines without issue.  Continue to work from home for [employer].  He has a history of diabetes mellitus and hyperlipidemia.  History of Mnire's disease  History of kidney stones    Review of Systems     Objective:   Physical Exam Blood pressure 120/80, pulse 97 temperature 98 degrees orally pulse oximetry 98% weight 250 pounds BMI 31.25  Skin warm and dry.  Nodes none.  Neck is supple without JVD thyromegaly or carotid bruits.  Chest clear to auscultation.  Cardiac exam regular rate and rhythm normal S1 and S2 without murmurs or gallops.  Hemoglobin A1c 6.7% and previously was 6.2% in August Unfortunately triglycerides have increased from 219 to 240.  Total cholesterol has decreased from 220 to 156.  Likely needs to get more exercise.  He will be able to do that with the Spring coming  He is on Lipitor 20 mg daily, Metformin 1000 mg twice daily Tradjenta 5 mg daily Assessment & Plan:  Type 2 diabetes mellitus-stable on current regimen  Mixed hyperlipidemia-continue with current dose of Lipitor.  He will try to watch diet and get more exercise  Plan: Schedule physical examination for August 2021.

## 2019-11-02 ENCOUNTER — Encounter: Payer: Self-pay | Admitting: Internal Medicine

## 2019-11-02 NOTE — Patient Instructions (Signed)
Continue to work on diet exercise and weight loss.  Continue current medications and follow-up in August.  Reminded about diabetic eye exam.

## 2020-03-01 ENCOUNTER — Other Ambulatory Visit: Payer: Self-pay | Admitting: Internal Medicine

## 2020-03-03 ENCOUNTER — Emergency Department
Admission: EM | Admit: 2020-03-03 | Discharge: 2020-03-04 | Disposition: A | Discharge status: Home / Self Care | Payer: 59 | Attending: Emergency Medicine | Admitting: Emergency Medicine

## 2020-03-03 DIAGNOSIS — E119 Type 2 diabetes mellitus without complications: Secondary | ICD-10-CM | POA: Diagnosis not present

## 2020-03-03 DIAGNOSIS — W57XXXA Bitten or stung by nonvenomous insect and other nonvenomous arthropods, initial encounter: Secondary | ICD-10-CM | POA: Insufficient documentation

## 2020-03-03 DIAGNOSIS — Z7984 Long term (current) use of oral hypoglycemic drugs: Secondary | ICD-10-CM | POA: Insufficient documentation

## 2020-03-03 DIAGNOSIS — Y999 Unspecified external cause status: Secondary | ICD-10-CM | POA: Insufficient documentation

## 2020-03-03 DIAGNOSIS — S60467A Insect bite (nonvenomous) of left little finger, initial encounter: Secondary | ICD-10-CM | POA: Insufficient documentation

## 2020-03-03 DIAGNOSIS — T7840XA Allergy, unspecified, initial encounter: Secondary | ICD-10-CM

## 2020-03-03 DIAGNOSIS — Z91030 Bee allergy status: Secondary | ICD-10-CM | POA: Diagnosis not present

## 2020-03-03 DIAGNOSIS — Y939 Activity, unspecified: Secondary | ICD-10-CM | POA: Insufficient documentation

## 2020-03-03 DIAGNOSIS — Y929 Unspecified place or not applicable: Secondary | ICD-10-CM | POA: Diagnosis not present

## 2020-03-03 DIAGNOSIS — T63441A Toxic effect of venom of bees, accidental (unintentional), initial encounter: Secondary | ICD-10-CM

## 2020-03-03 MED ORDER — FAMOTIDINE 20 MG PO TABS
20.0000 mg | ORAL_TABLET | Freq: Two times a day (BID) | ORAL | 0 refills | Status: DC
Start: 2020-03-03 — End: 2020-04-16

## 2020-03-03 MED ORDER — DIPHENHYDRAMINE HCL 50 MG/ML IJ SOLN
50.0000 mg | Freq: Once | INTRAMUSCULAR | Status: AC
  Administered 2020-03-03: 50 mg via INTRAVENOUS
  Filled 2020-03-03: qty 1

## 2020-03-03 MED ORDER — METHYLPREDNISOLONE SODIUM SUCC 125 MG IJ SOLR
125.0000 mg | Freq: Once | INTRAMUSCULAR | Status: AC
  Administered 2020-03-03: 125 mg via INTRAVENOUS
  Filled 2020-03-03: qty 2

## 2020-03-03 MED ORDER — EPINEPHRINE 0.3 MG/0.3ML IJ SOAJ
INTRAMUSCULAR | Status: AC
  Filled 2020-03-03: qty 0.3

## 2020-03-03 MED ORDER — PREDNISONE 20 MG PO TABS: 20.0000 mg | ORAL_TABLET | Freq: Every day | ORAL | 0 refills | Status: DC

## 2020-03-03 MED ORDER — PREDNISONE 20 MG PO TABS
60.0000 mg | ORAL_TABLET | Freq: Once | ORAL | Status: AC
  Administered 2020-03-04: 60 mg via ORAL
  Filled 2020-03-03: qty 3

## 2020-03-03 MED ORDER — FAMOTIDINE IN NACL 20-0.9 MG/50ML-% IV SOLN
20.0000 mg | Freq: Once | INTRAVENOUS | Status: AC
  Administered 2020-03-03: 20 mg via INTRAVENOUS
  Filled 2020-03-03: qty 50

## 2020-03-03 NOTE — Consult Note (Signed)
[facility] Tele[facility]urology Consult Services  Stat Consult  Date of Service:   03/03/2020 20:51:14  Impression:     .  R29.810 - Facial numbness/ Facial weakness  Comments/Sign-Out: the patient is a very pleasant gentleman with a known bee sting allergy clearly had an allergic reaction and really feels a pressure sensation to the right ear along with numbness around the right ear right cheek and right temple. Given the distribution of the numbness that he feels a pressure sensation at the clinical context feel that stroke is highly unlikely and this is secondary to inflammation from his allergic reaction.discussed with the patient that if his symptoms improve as would be expected with treatment of the allergic reaction he would not need any additional neurologic workup. He can follow-up with his primary care provider.  CT HEAD: Not Reviewed not done  Metrics: [facility] Notification Time: 03/03/2020 20:49:39 Stamp Time: 03/03/2020 20:51:14 Callback Response Time: 03/03/2020 20:58:25  Our recommendations are outlined below.   Disposition: Sign Off  Sign Out:     .  Discussed with Emergency Department Provider  ----------------------------------------------------------------------------------------------------  Chief Complaint: right facial numbness  History of Present Illness: Patient is a 54 year old Male.  the patient is a very pleasant 54 year old gentleman who has a known allergy to bee stings but didn't have access to an EpiPen. Hee also has a histtorry of hypertension and hyperlipidemia. He said that around 19:15 he got some bio be on hiis finger. He got hives on his chest his groin and developed an intense preessure behind thee right ear with some right-sided facial numbness that involves the right temple the right cheek and circles around his right ear where he feels the majority of the pressure. No facial droop, no other weakness numbness or tingling. He is  received some medications in the emergency department and is feeling better.   Past Medical History:     . Diabetes Mellitus     . Hyperlipidemia     . There is NO history of Hypertension     . There is NO history of Atrial Fibrillation     . There is NO history of Coronary Artery Disease     . There is NO history of Stroke  Anticoagulant use:  No  Antiplatelet use: No    Examination: BP(142/82), Pulse(83), Blood Glucose(p)  Neuro Exam:  General: Alert,Awake, Oriented to Time, Place, Person  Speech: Fluent:  Language: Intact:  Face: Symmetric:  Facial Sensation: Reduced: Right  Visual Fields: Intact:  Extraocular Movements: Intact:  Motor Exam: No Drift:  Sensation: Intact:  Coordination: Intact:     Patient/Family was informed the Neurology Consult would occur via TeleHealth consult by way of interactive audio and video telecommunications and consented to receiving care in this manner.  Patient is being evaluated for possible acute neurologic impairment and high probability of imminent or life-threatening deterioration. I spent total of 35 minutes providing care to this patient, including time for face to face visit via telemedicine, review of medical records, imaging studies and discussion of findings with providers, the patient and/or family.   Dr Katina Degree   [facility] [phone]  Case [id number]

## 2020-03-03 NOTE — Discharge Instructions (Signed)
Please take Benadryl to the over-the-counter pills 4 times a day tomorrow.  Be careful it can make you sleepy it would not drive or work or operate hazardous machinery.  Take the prednisone 1 a day in the evening tomorrow and the day after.  Take the Pepcid tomorrow.  Return for any increasing itchiness or call 911 if you get any shortness of breath or wooziness.  We will give you an EpiPen to use if you have a future bee sting and get short of breath or lightheaded.  If you does get a rash do not use the EpiPen.  Keep it with you though if you need it you might need it quickly.

## 2020-03-03 NOTE — ED Triage Notes (Signed)
Pt stung by wasp at 1915 in left hand. Rash over abd and chest with redness around mouth and numbness to the right side of face. Pt reports increased SOB and feeling as though he can not take a deep breath but no increased WOB noted while ambulating to treatment room. Pt also reports a hx of needing an epi pen but it was expired at home.

## 2020-03-03 NOTE — ED Notes (Signed)
Neuro cart at bedside. 

## 2020-03-03 NOTE — ED Provider Notes (Signed)
[facility] Emergency Department Provider Note   ____________________________________________   First MD Initiated Contact with Patient 03/03/20 2036     (approximate)  I have reviewed the triage vital signs and the nursing notes.   HISTORY  Chief Complaint Insect Bite    HPI Jerry Arroyo is a 54 y.o. male patient reports he was stung by bee on the left little finger proximal phalanx.  He began getting itchy and high the all over.  And then he began having some stuffy feeling in his right ear and numbness on the right side of his lips and cheek.  Is not numb on the scalp he does not have any weakness or slurry speech or facial asymmetry.  He came into the hospital.         Past Medical History:  Diagnosis Date   History of kidney stones     Patient Active Problem List   Diagnosis Date Noted   Uncontrolled type 2 diabetes mellitus with peripheral neuropathy ([place]) 11/05/2018   Cervical disc disease 04/02/2017   Hyperlipidemia 12/15/2014   Diabetes mellitus, new onset ([place]) 07/22/2013   History of kidney stones 07/22/2013    Past Surgical History:  Procedure Laterality Date   HERNIA REPAIR     left and right    Prior to Admission medications   Medication Sig Start Date End Date Taking? Authorizing Provider  atorvastatin (LIPITOR) 20 MG tablet TAKE 1 TABLET BY MOUTH  DAILY 03/01/20  Yes Baxley, Cresenciano Lick, MD  metFORMIN (GLUCOPHAGE) 500 MG tablet TAKE 1 TABLET BY MOUTH TWO  TIMES DAILY WITH A MEAL 05/04/19  Yes Baxley, Cresenciano Lick, MD  TRADJENTA 5 MG TABS tablet TAKE 1 TABLET BY MOUTH  DAILY 04/28/19  Yes Baxley, Cresenciano Lick, MD  Cyanocobalamin (VITAMIN B 12 PO) Take by mouth.    [provider]  famotidine (PEPCID) 20 MG tablet Take 1 tablet (20 mg total) by mouth 2 (two) times daily. 03/03/20 03/03/21  Nena Polio, MD  fexofenadine (ALLEGRA) 180 MG tablet Take 180 mg by mouth daily.    [provider]  Multiple Vitamin  (MULTIVITAMIN) tablet Take 1 tablet by mouth daily.      [provider]  predniSONE (DELTASONE) 20 MG tablet Take 1 tablet (20 mg total) by mouth daily. 03/03/20 03/03/21  Nena Polio, MD    Allergies Bee venom, Dilaudid [hydromorphone hcl], and Hydromorphone hcl  Family History  Problem Relation Age of Onset   Healthy Mother    Healthy Father     Social History Social History   Tobacco Use   Smoking status: Never Smoker   Smokeless tobacco: Never Used  Substance Use Topics   Alcohol use: No   Drug use: No    Review of Systems  Constitutional: No fever/chills Eyes: No visual changes. ENT: No sore throat. Cardiovascular: Denies chest pain. Respiratory: Denies shortness of breath. Gastrointestinal: No abdominal pain.  No nausea, no vomiting.  No diarrhea.  No constipation. Genitourinary: Negative for dysuria. Musculoskeletal: Negative for back pain. Skin: Negative for rash. Neurological: Negative for headaches, focal weakness  ____________________________________________   PHYSICAL EXAM:  VITAL SIGNS: ED Triage Vitals  Enc Vitals Group     BP --      Pulse Rate 03/03/20 2034 83     Resp 03/03/20 2034 16     Temp 03/03/20 2034 98.5 F (36.9 C)     Temp Source 03/03/20 2034 Oral     SpO2  03/03/20 2034 96 %     Weight 03/03/20 2041 230 lb (104.3 kg)     Height 03/03/20 2041 6\' 3"  (1.905 m)     Head Circumference --      Peak Flow --      Pain Score --      Pain Loc --      Pain Edu? --      Excl. in [place]? --     Constitutional: Alert and oriented. Well appearing and in no acute distress. Eyes: Conjunctivae are normal. PER. EOMI. Head: Atraumatic.  Right ear seems to be somewhat swollen and canals somewhat narrower than the left. Nose: No congestion/rhinnorhea. Mouth/Throat: Mucous membranes are moist.  Oropharynx non-erythematous. Neck: No stridor.   Cardiovascular: Normal rate, regular rhythm. Grossly normal heart sounds.  Good  peripheral circulation. Respiratory: Normal respiratory effort.  No retractions. Lungs CTAB. Gastrointestinal: Soft and nontender. No distention. No abdominal bruits. No CVA tenderness. Musculoskeletal: No lower extremity tenderness nor edema.  N Neurologic:  Normal speech and language. No gross focal neurologic deficits are appreciated except for the facial numbness is described. No gait instability. Skin:  Skin is warm, dry and intact.  Slight scattered hives and redness.  Patient took Benadryl at home.   ____________________________________________   LABS (all labs ordered are listed, but only abnormal results are displayed)  Labs Reviewed - No data to display ____________________________________________  EKG   ____________________________________________  RADIOLOGY  ED MD interpretation:  Official radiology report(s): No results found.  ____________________________________________   PROCEDURES  Procedure(s) performed (including Critical Care):  Procedures   ____________________________________________   INITIAL IMPRESSION / ASSESSMENT AND PLAN / ED COURSE  Patient seen by teleneurology who feel the numbness around the right side of his mouth is due to swelling around the ear.  This makes sense.  The patient has no other focal neurological signs.              ____________________________________________   FINAL CLINICAL IMPRESSION(S) / ED DIAGNOSES  Final diagnoses:  Allergic reaction, initial encounter  Bee sting reaction, accidental or unintentional, initial encounter     ED Discharge Orders         Ordered    famotidine (PEPCID) 20 MG tablet  2 times daily     Discontinue  Reprint     03/03/20 2357    predniSONE (DELTASONE) 20 MG tablet  Daily     Discontinue  Reprint     03/03/20 2357           Note:  This document was prepared using Dragon voice recognition software and may include unintentional dictation errors.    Nena Polio, MD 03/03/20 [phone]

## 2020-03-04 ENCOUNTER — Telehealth: Payer: Self-pay | Admitting: Internal Medicine

## 2020-03-04 MED ORDER — EPINEPHRINE 0.3 MG/0.3ML IJ SOAJ: 0.3000 mg | INTRAMUSCULAR | 3 refills | Status: AC | PRN

## 2020-03-04 NOTE — Telephone Encounter (Signed)
Called to check on patient after ED visit.Patient stated he is having some SOB today he is using his ProAir Inhaler and taking Benadryl as advised by ED. He is going by pharmacy to pick up prescriptions when he gets off work. Epipen and prednisone. I let him know that we would be placing a referral to Allergy he was good with that. Said he would be glad to follow up with them to manage bee allergy. He will call if he needs anything.

## 2020-03-07 ENCOUNTER — Other Ambulatory Visit: Payer: Self-pay | Admitting: Internal Medicine

## 2020-03-17 ENCOUNTER — Other Ambulatory Visit: Payer: Self-pay | Admitting: Internal Medicine

## 2020-03-25 ENCOUNTER — Ambulatory Visit: Payer: 59 | Admitting: Allergy

## 2020-04-15 ENCOUNTER — Other Ambulatory Visit: Payer: 59 | Admitting: Internal Medicine

## 2020-04-15 ENCOUNTER — Other Ambulatory Visit: Payer: Self-pay

## 2020-04-15 DIAGNOSIS — H8109 Meniere's disease, unspecified ear: Secondary | ICD-10-CM

## 2020-04-15 DIAGNOSIS — Z125 Encounter for screening for malignant neoplasm of prostate: Secondary | ICD-10-CM

## 2020-04-15 DIAGNOSIS — E119 Type 2 diabetes mellitus without complications: Secondary | ICD-10-CM

## 2020-04-15 DIAGNOSIS — Z0000 Encounter for general adult medical examination without abnormal findings: Secondary | ICD-10-CM

## 2020-04-15 DIAGNOSIS — M5090 Cervical disc disorder, unspecified, unspecified cervical region: Secondary | ICD-10-CM

## 2020-04-15 DIAGNOSIS — E782 Mixed hyperlipidemia: Secondary | ICD-10-CM

## 2020-04-15 DIAGNOSIS — Z87442 Personal history of urinary calculi: Secondary | ICD-10-CM

## 2020-04-16 ENCOUNTER — Ambulatory Visit (INDEPENDENT_AMBULATORY_CARE_PROVIDER_SITE_OTHER): Payer: 59 | Admitting: Internal Medicine

## 2020-04-16 ENCOUNTER — Encounter: Payer: Self-pay | Admitting: Internal Medicine

## 2020-04-16 VITALS — BP 120/70 | HR 84 | Ht 75.0 in | Wt 250.0 lb

## 2020-04-16 DIAGNOSIS — E785 Hyperlipidemia, unspecified: Secondary | ICD-10-CM

## 2020-04-16 DIAGNOSIS — F411 Generalized anxiety disorder: Secondary | ICD-10-CM | POA: Diagnosis not present

## 2020-04-16 DIAGNOSIS — H8109 Meniere's disease, unspecified ear: Secondary | ICD-10-CM

## 2020-04-16 DIAGNOSIS — Z0000 Encounter for general adult medical examination without abnormal findings: Secondary | ICD-10-CM | POA: Diagnosis not present

## 2020-04-16 DIAGNOSIS — G609 Hereditary and idiopathic neuropathy, unspecified: Secondary | ICD-10-CM

## 2020-04-16 DIAGNOSIS — H9313 Tinnitus, bilateral: Secondary | ICD-10-CM | POA: Diagnosis not present

## 2020-04-16 DIAGNOSIS — Z6831 Body mass index (BMI) 31.0-31.9, adult: Secondary | ICD-10-CM

## 2020-04-16 DIAGNOSIS — Z87442 Personal history of urinary calculi: Secondary | ICD-10-CM | POA: Diagnosis not present

## 2020-04-16 DIAGNOSIS — E1169 Type 2 diabetes mellitus with other specified complication: Secondary | ICD-10-CM

## 2020-04-16 DIAGNOSIS — E1140 Type 2 diabetes mellitus with diabetic neuropathy, unspecified: Secondary | ICD-10-CM

## 2020-04-16 LAB — CBC WITH DIFFERENTIAL/PLATELET
Absolute Monocytes: 557 cells/uL (ref 200–950)
Basophils Absolute: 41 cells/uL (ref 0–200)
Basophils Relative: 0.7 %
Eosinophils Absolute: 110 cells/uL (ref 15–500)
Eosinophils Relative: 1.9 %
HCT: 42 % (ref 38.5–50.0)
Hemoglobin: 14.3 g/dL (ref 13.2–17.1)
Lymphs Abs: 1955 cells/uL (ref 850–3900)
MCH: 29.8 pg (ref 27.0–33.0)
MCHC: 34 g/dL (ref 32.0–36.0)
MCV: 87.5 fL (ref 80.0–100.0)
MPV: 10 fL (ref 7.5–12.5)
Monocytes Relative: 9.6 %
Neutro Abs: 3138 cells/uL (ref 1500–7800)
Neutrophils Relative %: 54.1 %
Platelets: 252 10*3/uL (ref 140–400)
RBC: 4.8 10*6/uL (ref 4.20–5.80)
RDW: 13.3 % (ref 11.0–15.0)
Total Lymphocyte: 33.7 %
WBC: 5.8 10*3/uL (ref 3.8–10.8)

## 2020-04-16 LAB — POCT URINALYSIS DIPSTICK
Appearance: NEGATIVE
Bilirubin, UA: NEGATIVE
Blood, UA: NEGATIVE
Glucose, UA: POSITIVE — AB
Ketones, UA: NEGATIVE
Leukocytes, UA: NEGATIVE
Nitrite, UA: NEGATIVE
Odor: NEGATIVE
Protein, UA: NEGATIVE
Spec Grav, UA: 1.01 (ref 1.010–1.025)
Urobilinogen, UA: 0.2 E.U./dL
pH, UA: 6.5 (ref 5.0–8.0)

## 2020-04-16 LAB — MICROALBUMIN / CREATININE URINE RATIO
Creatinine, Urine: 130 mg/dL (ref 20–320)
Microalb Creat Ratio: 4 mcg/mg creat (ref <30)
Microalb, Ur: 0.5 mg/dL

## 2020-04-16 LAB — COMPLETE METABOLIC PANEL WITH GFR
AG Ratio: 1.7 (calc) (ref 1.0–2.5)
ALT: 26 U/L (ref 9–46)
AST: 18 U/L (ref 10–35)
Albumin: 4.4 g/dL (ref 3.6–5.1)
Alkaline phosphatase (APISO): 47 U/L (ref 35–144)
BUN: 14 mg/dL (ref 7–25)
CO2: 27 mmol/L (ref 20–32)
Calcium: 9.1 mg/dL (ref 8.6–10.3)
Chloride: 105 mmol/L (ref 98–110)
Creat: 0.74 mg/dL (ref 0.70–1.33)
GFR, Est African American: 120 mL/min/{1.73_m2} (ref >=60)
GFR, Est Non African American: 104 mL/min/{1.73_m2} (ref >=60)
Globulin: 2.6 g/dL (calc) (ref 1.9–3.7)
Glucose, Bld: 126 mg/dL — ABNORMAL HIGH (ref 65–99)
Potassium: 4.2 mmol/L (ref 3.5–5.3)
Sodium: 139 mmol/L (ref 135–146)
Total Bilirubin: 0.6 mg/dL (ref 0.2–1.2)
Total Protein: 7 g/dL (ref 6.1–8.1)

## 2020-04-16 LAB — HEMOGLOBIN A1C
Hgb A1c MFr Bld: 7.1 % of total Hgb — ABNORMAL HIGH (ref <5.7)
Mean Plasma Glucose: 157 (calc)
eAG (mmol/L): 8.7 (calc)

## 2020-04-16 LAB — PSA: PSA: 0.5 ng/mL (ref <=4.0)

## 2020-04-16 LAB — LIPID PANEL
Cholesterol: 152 mg/dL (ref <200)
HDL: 38 mg/dL — ABNORMAL LOW (ref >=40)
LDL Cholesterol (Calc): 85 mg/dL (calc)
Non-HDL Cholesterol (Calc): 114 mg/dL (calc) (ref <130)
Total CHOL/HDL Ratio: 4 (calc) (ref <5.0)
Triglycerides: 194 mg/dL — ABNORMAL HIGH (ref <150)

## 2020-04-16 MED ORDER — METFORMIN HCL 1000 MG PO TABS: 1000.0000 mg | ORAL_TABLET | Freq: Two times a day (BID) | ORAL | 3 refills | Status: DC

## 2020-04-16 MED ORDER — GABAPENTIN 300 MG PO CAPS: 300.0000 mg | ORAL_CAPSULE | Freq: Every day | ORAL | 3 refills | Status: DC

## 2020-04-16 MED ORDER — ALPRAZOLAM 0.5 MG PO TABS
0.5000 mg | ORAL_TABLET | Freq: Two times a day (BID) | ORAL | 1 refills | Status: DC | PRN
Start: 2020-04-16 — End: 2020-10-01

## 2020-04-16 MED ORDER — ATORVASTATIN CALCIUM 40 MG PO TABS
40.0000 mg | ORAL_TABLET | Freq: Every day | ORAL | 3 refills | Status: DC
Start: 2020-04-16 — End: 2020-07-16

## 2020-04-16 NOTE — Progress Notes (Signed)
Subjective:    Patient ID: Jerry Arroyo, male    DOB: 01/28/1966, 54 y.o.   MRN: [id number]  HPI 54 year old Male for health maintenance exam and evaluation of medical issues.  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  [facility] hospitals and treated with HCTZ.  This seems to be stable.  History of diabetes mellitus.  Hemoglobin A1c has increased to 7.1% from February when it was 6.7%.  We are going to add Tradjenta to Metformin.  Currently taking 1000 mg of Metformin twice daily.  Needs to watch diet and get more exercise.  History of calcium oxalate kidney stones.  No recent episodes.  Had left lithotripsy in 2007.  Has developed some anxiety issues and Xanax has been prescribed for him.  Seems to be work-related.  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  Is also developed some numbness in his feet and will be prescribed Neurontin 300 mg at bedtime.  He is on Lipitor  daily for hyperlipidemia.  Increase Lipitor to 40 mg daily given elevated triglycerides.  Follow-up in November.  Triglycerides are elevated at 194.  He has a low HDL of 38 and total cholesterol is 152 with LDL of 85.  Fasting glucose is 126.  BUN and creatinine are normal.  Liver functions are normal.  Electrolytes are normal.  He was stung by bee recently, had an allergic reaction and is scheduled for venom testing in the near future.  We will keep EpiPen on hand.  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  He is married.  Wife is an Automotive engineer.  2 daughters, 1 adult daughter who is married.  Another daughter has finished high school and will be attending [employer].  He does not smoke or consume alcohol.  History of Herpes zoster in May 2019.  Left inguinal hernia repair 1993, right inguinal hernia repair 1998.  History of peptic ulcer disease at age 4.  History of hypertriglyceridemia.  Sees Dr. Fredrich Birks annually for kidney stone follow-up.  Takes  potassium citrate.  History of left foot plantar fasciitis seen by Dr. Gershon Mussel.  In October 2947 he saw Dr. Erik Obey for Mnire's disease.  He will need to transfer from [facility].  He was having issues with tinnitus.  Tympanograms were normal on each side.  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  He really did not think patient had Mnire's disease at the present time.  Pure-tone audiometry showed normal hearing with a tiny high-frequency drop off on each side.    Review of Systems see above-main new issue is anxiety and work-related stress     Objective:   Physical Exam Blood pressure 120/70 pulse 84 pulse oximetry 96% weight 250 pounds height 6 feet 3 inches BMI 31.25  Skin warm and dry.  Nodes none.  TMs are clear.  Neck is supple without JVD thyromegaly or carotid bruits.  Chest is clear to auscultation without rales or wheezing.  Cardiac exam regular rate and rhythm normal S1 and S2 without murmurs or gallops.  Abdomen is soft nondistended without hepatosplenomegaly masses or tenderness.  Prostate is normal without nodules.  No lower extremity pitting edema.  Neuro intact without focal deficits.  He seems slightly anxious.       Assessment & Plan:  Anxiety state related to work stress.  Have prescribed Xanax 0.25 mg to take sparingly up to 2  times daily as needed  Hyperlipidemia-mainly hypertriglyceridemia and is on Lipitor increased to 40 mg daily.  Triglycerides are elevated.  Needs to watch diet, get more exercise and continue with Lipitor.  Bee sting allergy to be seen by allergist very soon.  He has EpiPen.  Peripheral neuropathy in feet.  Likely diabetic induced.  We did not do B12 levels today.  He will be placed on low-dose gabapentin.  Diabetes mellitus-control could be better at 7.1% have added Tradjenta to Metformin.  Needs follow-up in a few weeks on diabetic control.  History of kidney stones followed by Dr. Jeffie Pollock  BMI  31-continue to work on diet and exercise  Plan: Follow-up in November with office visit lipid panel liver functions and hemoglobin A1c.

## 2020-04-16 NOTE — Patient Instructions (Addendum)
Increase metformin to 1000 mg twice a day and RTC in 3 months. Will need lipid,liver AIC at that time. Watch diet and exercise. Take Xanax sparing for anxiety as needed. Increase Lipitor generic to 40 mg daily due to elevated triglycerides. Take Gabpentin for left greater than right foot neuropathy.

## 2020-04-20 NOTE — Progress Notes (Signed)
New Patient Note  RE: Jerry Arroyo MRN: [id number] DOB: 01/03/1966 Date of Office Visit: 04/21/2020  Referring provider: Elby Showers, MD Primary care provider: Elby Showers, MD  Chief Complaint: Allergic Reaction (Yellow Jacket Sting 3 weeks ago, Hives anaphylaxis )  History of Present Illness: I had the pleasure of seeing Jerry Arroyo for initial evaluation at the [facility] on 04/21/2020. He is a 54 y.o. male, who is referred here by Elby Showers, MD for the evaluation of hymenoptera allergy.  Patient was working outdoors and was stung by a yellow jacket on his left finger. Within 5 minutes patent had whole body pruritus. He took a shower and it got worse. He developed whole body rash and felt some right sided facial numbness and trouble breathing. He went to the ER and was treated with methylprednisolone, benadryl, famotidine and oral prednisone with good benefit. Symptoms resolved within few hours. No prior work up.   Usually has large localized reactions and resolves within a few days. Never had this type of reaction before.   Patient does have an Epipen which he has not had to use.   03/03/2020 ER HPI: "Jerry Arroyo is a 54 y.o. male patient reports he was stung by bee on the left little finger proximal phalanx.  He began getting itchy and high the all over.  And then he began having some stuffy feeling in his right ear and numbness on the right side of his lips and cheek.  Is not numb on the scalp he does not have any weakness or slurry speech or facial asymmetry.  He came into the hospital."  Assessment and Plan: Mehran is a 54 y.o. male with: Hymenoptera allergy Stung by a yellow jacket on his finger and within 5 minutes developed whole body pruritus which progressed to full body rash and trouble breathing.  Treated in the ER with methylprednisolone, Benadryl and famotidine with good benefit.  Symptoms resolved after a few hours.  No prior  work-up.  Usually gets large localized reactions after hymenoptera stings and never had such severe reactions as this.  Continue to avoid.  Have Epinephrine injectable device available especially when outdoors.  For mild symptoms you can take over the counter antihistamines such as Benadryl and monitor symptoms closely. If symptoms worsen or if you have severe symptoms including breathing issues, throat closure, significant swelling, whole body hives, severe diarrhea and vomiting, lightheadedness then inject epinephrine and seek immediate medical care afterwards.  Emergency action plan given. Get bloodwork for hymenoptera panel and tryptase level. If positive then will recommend to start allergy injections.   If negative then will get skin testing next to confirm which is done every other month in our [facility] office.   Shortness of breath Episodes of shortness of breath for the last few years.  Used to have albuterol in the past however now uses over-the-counter Primatene Mist inhaler with some benefit about once a week.  Main triggers include allergies in the fall.  Today's breathing test showed: some restriction due to body habitus.  May use albuterol rescue inhaler 2 puffs every 4 to 6 hours as needed for shortness of breath, chest tightness, coughing, and wheezing. May use albuterol rescue inhaler 2 puffs 5 to 15 minutes prior to strenuous physical activities. Monitor frequency of use.   Allergic conjunctivitis of both eyes Noticed some conjunctivitis symptoms since stopped taking over-the-counter antihistamines.  Skin testing over 30 years ago showed  multiple positives.  No prior allergy immunotherapy.  Declines environmental allergy testing today.   May use over the counter antihistamines such as Zyrtec (cetirizine), Claritin (loratadine), Allegra (fexofenadine), or Xyzal (levocetirizine) daily as needed.  If symptoms worsen then will consider environmental allergy testing in the  future.  Return in about 1 year (around 04/21/2021).  Meds ordered this encounter  Medications  . albuterol (VENTOLIN HFA) 108 (90 Base) MCG/ACT inhaler    Sig: Inhale 2 puffs into the lungs every 4 (four) hours as needed for wheezing or shortness of breath.    Dispense:  18 g    Refill:  1    Lab Orders     Tryptase     Allergen Hymenoptera Panel   Other allergy screening: Asthma:   He reports symptoms of chest tightness, shortness of breath, wheezing for few years. Current medications include primatene mist inhaler which help. He reports not using aerochamber with inhalers. He tried the following inhalers: albuterol. Main triggers are allergies in the fall. In the last month, frequency of symptoms: 1x/week. Frequency of nocturnal symptoms: 0x/month. Frequency of SABA use: 1x/week. Interference with physical activity: sometimes. Sleep is undisturbed. In the last 12 months, emergency room visits/urgent care visits/doctor office visits or hospitalizations due to respiratory issues: 0. In the last 12 months, oral steroids courses: 0. Lifetime history of hospitalization for respiratory issues: no. Prior intubations: no. History of pneumonia: no. He was evaluated by allergist in the past. Smoking exposure: no. Up to date with flu vaccine: yes. Up to date with pneumonia vaccine: no. Up to date with COVID-19 vaccine: yes.  History of reflux: no.  Rhino conjunctivitis: yes  Itchy eyes as he stopped taking OTC antihistamines.  Patient had skin testing over 30+ years ago. No prior allergy immunotherapy.   Food allergy: no Medication allergy: no Urticaria: no Eczema:no History of recurrent infections suggestive of immunodeficency: no  Diagnostics: Spirometry:  Tracings reviewed. His effort: Good reproducible efforts. FVC: 3.94L FEV1: 3.27L, 73% predicted FEV1/FVC ratio: 83% Interpretation: Spirometry consistent with possible restrictive disease.  Please see scanned spirometry results for  details.  Past Medical History: Patient Active Problem List   Diagnosis Date Noted  . 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 Shortness of breath 04/21/2020  . Allergic conjunctivitis of both eyes 04/21/2020  . Uncontrolled type 2 diabetes mellitus with peripheral neuropathy ([place]) 11/05/2018  . Cervical disc disease 04/02/2017  . Hyperlipidemia 12/15/2014  . Diabetes mellitus, new onset ([place]) 07/22/2013  . History of kidney stones 07/22/2013   Past Medical History:  Diagnosis Date  . History of kidney stones    Past Surgical History: Past Surgical History:  Procedure Laterality Date  . ADENOIDECTOMY    . HERNIA REPAIR     left and right  . TONSILLECTOMY    . TYMPANOSTOMY TUBE PLACEMENT     Medication List:  Current Outpatient Medications  Medication Sig Dispense Refill  . ALPRAZolam (XANAX) 0.5 MG tablet Take 1 tablet (0.5 mg total) by mouth 2 (two) times daily as needed for anxiety. 60 tablet 1  . atorvastatin (LIPITOR) 40 MG tablet Take 1 tablet (40 mg total) by mouth daily. 90 tablet 3  . cetirizine (ZYRTEC) 10 MG tablet Take 10 mg by mouth daily.    . Cyanocobalamin (VITAMIN B 12 PO) Take by mouth.    . EPINEPHrine (EPIPEN 2-PAK) 0.3 mg/0.3 mL IJ SOAJ injection Inject 0.3 mLs (0.3 mg total) into the muscle as needed for anaphylaxis. 1 each 3  .  fexofenadine (ALLEGRA) 180 MG tablet Take 180 mg by mouth daily.    Marland Kitchen gabapentin (NEURONTIN) 300 MG capsule Take 1 capsule (300 mg total) by mouth at bedtime. 90 capsule 3  . metFORMIN (GLUCOPHAGE) 1000 MG tablet Take 1 tablet (1,000 mg total) by mouth 2 (two) times daily with a meal. 180 tablet 3  . Multiple Vitamin (MULTIVITAMIN) tablet Take 1 tablet by mouth daily.      . TRADJENTA 5 MG TABS tablet TAKE 1 TABLET BY MOUTH  DAILY 90 tablet 3  . albuterol (VENTOLIN HFA) 108 (90 Base) MCG/ACT inhaler Inhale 2 puffs into the lungs every 4 (four) hours as needed for wheezing or shortness of breath. 18 g 1   No current facility-administered  medications for this visit.   Allergies: Allergies  Allergen Reactions  . Bee Venom Swelling   Social History: Social History   Socioeconomic History  . Marital status: Single    Spouse name: Not on file  . Number of children: Not on file  . Years of education: Not on file  . Highest education level: Not on file  Occupational History  . Not on file  Tobacco Use  . Smoking status: Never Smoker  . Smokeless tobacco: Never Used  Vaping Use  . Vaping Use: Never used  Substance and Sexual Activity  . Alcohol use: No  . Drug use: No  . Sexual activity: Not on file  Other Topics Concern  . Not on file  Social History Narrative  . Not on file   Social Determinants of Health   Financial Resource Strain:   . Difficulty of Paying Living Expenses:   Food Insecurity:   . Worried About Charity fundraiser in the Last Year:   . Arboriculturist in the Last Year:   Transportation Needs:   . Film/video editor (Medical):   Marland Kitchen Lack of Transportation (Non-Medical):   Physical Activity:   . Days of Exercise per Week:   . Minutes of Exercise per Session:   Stress:   . Feeling of Stress :   Social Connections:   . Frequency of Communication with Friends and Family:   . Frequency of Social Gatherings with Friends and Family:   . Attends Religious Services:   . Active Member of Clubs or Organizations:   . Attends Archivist Meetings:   Marland Kitchen Marital Status:    Lives in a house. Smoking: denies Occupation: Curator HistoryFreight forwarder in the house: no Charity fundraiser in the family room: yes Carpet in the bedroom: yes Heating: heat pump Cooling: heat pump Pet: yes 1 cat x 9 yrs, 1 dog x 4 yrs  Family History: Family History  Problem Relation Age of Onset  . Healthy Mother   . Allergic rhinitis Mother   . Healthy Father    Review of Systems  Constitutional: Negative for appetite change, chills, fever and unexpected weight change.  HENT:  Negative for congestion and rhinorrhea.   Eyes: Positive for itching.  Respiratory: Negative for cough, chest tightness, shortness of breath and wheezing.   Cardiovascular: Negative for chest pain.  Gastrointestinal: Negative for abdominal pain.  Genitourinary: Negative for difficulty urinating.  Skin: Negative for rash.  Neurological: Negative for headaches.   Objective: BP 120/78 (BP Location: Left Arm, Patient Position: Sitting, Cuff Size: Normal)   Pulse 76   Temp 98.3 F (36.8 C) (Temporal)   Resp 16   Ht 6\' 3"  (1.905 m)  Wt 256 lb (116.1 kg)   SpO2 97%   BMI 32.00 kg/m  Body mass index is 32 kg/m. Physical Exam Vitals and nursing note reviewed.  Constitutional:      Appearance: Normal appearance. He is well-developed.  HENT:     Head: Normocephalic and atraumatic.     Right Ear: External ear normal.     Left Ear: External ear normal.     Nose: Nose normal.     Mouth/Throat:     Mouth: Mucous membranes are moist.     Pharynx: Oropharynx is clear.  Eyes:     Conjunctiva/sclera: Conjunctivae normal.  Cardiovascular:     Rate and Rhythm: Normal rate and regular rhythm.     Heart sounds: Normal heart sounds. No murmur heard.  No friction rub. No gallop.   Pulmonary:     Effort: Pulmonary effort is normal.     Breath sounds: Normal breath sounds. No wheezing, rhonchi or rales.  Abdominal:     Palpations: Abdomen is soft.  Musculoskeletal:     Cervical back: Neck supple.  Skin:    General: Skin is warm.     Findings: No rash.  Neurological:     Mental Status: He is alert and oriented to person, place, and time.  Psychiatric:        Behavior: Behavior normal.    The plan was reviewed with the patient/family, and all questions/concerned were addressed.  It was my pleasure to see Marisol today and participate in his care. Please feel free to contact me with any questions or concerns.  Sincerely,  Rexene Alberts, DO Allergy & Immunology  [facility] office: [phone] [facility] office: [place] office: [phone]

## 2020-04-21 ENCOUNTER — Other Ambulatory Visit: Payer: Self-pay

## 2020-04-21 ENCOUNTER — Encounter: Payer: Self-pay | Admitting: Allergy

## 2020-04-21 ENCOUNTER — Ambulatory Visit (INDEPENDENT_AMBULATORY_CARE_PROVIDER_SITE_OTHER): Payer: 59 | Admitting: Allergy

## 2020-04-21 VITALS — BP 120/78 | HR 76 | Temp 98.3°F | Resp 16 | Ht 75.0 in | Wt 256.0 lb

## 2020-04-21 DIAGNOSIS — Z91030 Bee allergy status: Secondary | ICD-10-CM

## 2020-04-21 DIAGNOSIS — R0602 Shortness of breath: Secondary | ICD-10-CM | POA: Diagnosis not present

## 2020-04-21 DIAGNOSIS — J3089 Other allergic rhinitis: Secondary | ICD-10-CM | POA: Insufficient documentation

## 2020-04-21 DIAGNOSIS — H1013 Acute atopic conjunctivitis, bilateral: Secondary | ICD-10-CM | POA: Diagnosis not present

## 2020-04-21 DIAGNOSIS — Z91038 Other insect allergy status: Secondary | ICD-10-CM | POA: Insufficient documentation

## 2020-04-21 MED ORDER — ALBUTEROL SULFATE HFA 108 (90 BASE) MCG/ACT IN AERS
2.0000 | INHALATION_SPRAY | RESPIRATORY_TRACT | 1 refills | Status: DC | PRN
Start: 2020-04-21 — End: 2020-07-12

## 2020-04-21 NOTE — Assessment & Plan Note (Signed)
Noticed some conjunctivitis symptoms since stopped taking over-the-counter antihistamines.  Skin testing over 30 years ago showed multiple positives.  No prior allergy immunotherapy.  Declines environmental allergy testing today.   May use over the counter antihistamines such as Zyrtec (cetirizine), Claritin (loratadine), Allegra (fexofenadine), or Xyzal (levocetirizine) daily as needed.  If symptoms worsen then will consider environmental allergy testing in the future.

## 2020-04-21 NOTE — Assessment & Plan Note (Addendum)
Stung by a yellow jacket on his finger and within 5 minutes developed whole body pruritus which progressed to full body rash and trouble breathing.  Treated in the ER with methylprednisolone, Benadryl and famotidine with good benefit.  Symptoms resolved after a few hours.  No prior work-up.  Usually gets large localized reactions after hymenoptera stings and never had such severe reactions as this.  Continue to avoid.  Have Epinephrine injectable device available especially when outdoors.  For mild symptoms you can take over the counter antihistamines such as Benadryl and monitor symptoms closely. If symptoms worsen or if you have severe symptoms including breathing issues, throat closure, significant swelling, whole body hives, severe diarrhea and vomiting, lightheadedness then inject epinephrine and seek immediate medical care afterwards.  Emergency action plan given. Get bloodwork for hymenoptera panel and tryptase level. If positive then will recommend to start allergy injections.   If negative then will get skin testing next to confirm which is done every other month in our [facility] office.

## 2020-04-21 NOTE — Assessment & Plan Note (Signed)
Episodes of shortness of breath for the last few years.  Used to have albuterol in the past however now uses over-the-counter Primatene Mist inhaler with some benefit about once a week.  Main triggers include allergies in the fall.  Today's breathing test showed: some restriction due to body habitus.  May use albuterol rescue inhaler 2 puffs every 4 to 6 hours as needed for shortness of breath, chest tightness, coughing, and wheezing. May use albuterol rescue inhaler 2 puffs 5 to 15 minutes prior to strenuous physical activities. Monitor frequency of use.

## 2020-04-21 NOTE — Patient Instructions (Addendum)
Yellow jacket sting  Continue to avoid.  Have Epinephrine injectable device on you especially when outdoors.  For mild symptoms you can take over the counter antihistamines such as Benadryl and monitor symptoms closely. If symptoms worsen or if you have severe symptoms including breathing issues, throat closure, significant swelling, whole body hives, severe diarrhea and vomiting, lightheadedness then inject epinephrine and seek immediate medical care afterwards.  Emergency action plan given. Get bloodwork If positive then will recommend to start allergy injections.   If negative then will get skin testing next. We are ordering labs, so please allow 1-2 weeks for the results to come back. With the newly implemented Cures Act, the labs might be visible to you at the same time that they become visible to me. However, I will not address the results until all of the results are back, so please be patient.  In the meantime, continue recommendations in your patient instructions, including avoidance measures (if applicable), until you hear from me.  Breathing:  Today's breathing test showed: some restriction due to body habitus.  May use albuterol rescue inhaler 2 puffs or nebulizer every 4 to 6 hours as needed for shortness of breath, chest tightness, coughing, and wheezing. May use albuterol rescue inhaler 2 puffs 5 to 15 minutes prior to strenuous physical activities. Monitor frequency of use.   Environmental allergies:  May use over the counter antihistamines such as Zyrtec (cetirizine), Claritin (loratadine), Allegra (fexofenadine), or Xyzal (levocetirizine) daily as needed.  If your symptoms worsen then will consider environmental allergy testing in the future.  Follow up in 1 year or sooner if needed.

## 2020-04-24 LAB — ALLERGEN HYMENOPTERA PANEL
Bumblebee: <0.1 kU/L
Honeybee IgE: 1.35 kU/L — AB
Hornet, White Face, IgE: 5.44 kU/L — AB
Hornet, Yellow, IgE: 1.69 kU/L — AB
Paper Wasp IgE: 8.69 kU/L — AB
Yellow Jacket, IgE: 10.2 kU/L — AB

## 2020-04-24 LAB — TRYPTASE: Tryptase: 3.1 ug/L (ref 2.2–13.2)

## 2020-04-26 NOTE — Addendum Note (Signed)
Addended by: Garnet Sierras on: 04/26/2020 05:24 PM   Modules accepted: Orders

## 2020-05-12 ENCOUNTER — Other Ambulatory Visit: Payer: Self-pay

## 2020-05-12 ENCOUNTER — Ambulatory Visit (INDEPENDENT_AMBULATORY_CARE_PROVIDER_SITE_OTHER): Payer: 59

## 2020-05-12 DIAGNOSIS — Z91030 Bee allergy status: Secondary | ICD-10-CM

## 2020-05-12 DIAGNOSIS — Z91038 Other insect allergy status: Secondary | ICD-10-CM

## 2020-05-12 NOTE — Progress Notes (Signed)
Immunotherapy   Patient Details  Name: Jerry Arroyo MRN: [id number] Date of Birth: 1965-11-02  05/12/2020  [name] here to start Venom injections. Patient received 0.05 of MV, HB and Wasp. Patient waited in an exam room with no problems.:   Frequency: Weeklu Epi-Pen: Yes Consent signed and patient instructions given.   [name] 05/12/2020, 9:16 AM

## 2020-05-18 ENCOUNTER — Ambulatory Visit (INDEPENDENT_AMBULATORY_CARE_PROVIDER_SITE_OTHER): Payer: 59 | Admitting: *Deleted

## 2020-05-18 ENCOUNTER — Other Ambulatory Visit: Payer: Self-pay

## 2020-05-18 DIAGNOSIS — Z91030 Bee allergy status: Secondary | ICD-10-CM | POA: Diagnosis not present

## 2020-05-18 DIAGNOSIS — Z91038 Other insect allergy status: Secondary | ICD-10-CM

## 2020-05-26 ENCOUNTER — Ambulatory Visit (INDEPENDENT_AMBULATORY_CARE_PROVIDER_SITE_OTHER): Payer: 59

## 2020-05-26 DIAGNOSIS — Z91030 Bee allergy status: Secondary | ICD-10-CM

## 2020-06-01 ENCOUNTER — Ambulatory Visit (INDEPENDENT_AMBULATORY_CARE_PROVIDER_SITE_OTHER): Payer: 59 | Admitting: *Deleted

## 2020-06-01 ENCOUNTER — Other Ambulatory Visit: Payer: Self-pay

## 2020-06-01 DIAGNOSIS — Z91030 Bee allergy status: Secondary | ICD-10-CM | POA: Diagnosis not present

## 2020-06-01 DIAGNOSIS — Z91038 Other insect allergy status: Secondary | ICD-10-CM

## 2020-06-09 ENCOUNTER — Ambulatory Visit (INDEPENDENT_AMBULATORY_CARE_PROVIDER_SITE_OTHER): Payer: 59 | Admitting: *Deleted

## 2020-06-09 ENCOUNTER — Telehealth: Payer: Self-pay | Admitting: *Deleted

## 2020-06-09 ENCOUNTER — Other Ambulatory Visit: Payer: Self-pay

## 2020-06-09 DIAGNOSIS — Z91038 Other insect allergy status: Secondary | ICD-10-CM | POA: Diagnosis not present

## 2020-06-09 NOTE — Telephone Encounter (Signed)
Patient came in today to do an up dose of his venom injections for Mixed Vespid, Honey Bee, and Wasp. He was supposed to receive .57mL of .55mcg.mL of Mixed Vespid, .35mL of .37mcg/mL Honey Bee and .52mL of .71mcg/mL Wasp. After giving the patient the injections I noticed that some of the mixed down vials for Mixed Vespid and Wasp had been switched around and I mixed down from the incorrect strength vial for Mixed Vespid and Wasp. Patient did receive the correct dose and strength of Honey Bee. He received .42ml of 20mcg/ml Mixed Vespid and .55mL of 31mcg/mL of Wasp instead. Rinaldo Cloud of the situation and she called the patient for me since I was unable to call the patient at that time since working in the injection room. Beth explained the incident to the patient and asked how he was feeling and had him visually check his arms. Patient denied any local or systemic reaction. A safety zone has been submitted of the incident. His venom flow sheet has been updated to instruct the next person administering his injection to repeat the correct dose next week when he comes in.

## 2020-06-16 ENCOUNTER — Ambulatory Visit (INDEPENDENT_AMBULATORY_CARE_PROVIDER_SITE_OTHER): Payer: 59 | Admitting: *Deleted

## 2020-06-16 ENCOUNTER — Other Ambulatory Visit: Payer: Self-pay

## 2020-06-16 DIAGNOSIS — Z91030 Bee allergy status: Secondary | ICD-10-CM

## 2020-06-16 DIAGNOSIS — T63441D Toxic effect of venom of bees, accidental (unintentional), subsequent encounter: Secondary | ICD-10-CM

## 2020-06-16 NOTE — Telephone Encounter (Signed)
Patient returned to the office on 06/16/2020 for his next venom injections.  Patient stated he did not have any issue the remaining day after his last injection following my phone call.  Patient did well the remaining week without any local or systemic reactions.

## 2020-06-23 ENCOUNTER — Other Ambulatory Visit: Payer: Self-pay

## 2020-06-23 ENCOUNTER — Ambulatory Visit (INDEPENDENT_AMBULATORY_CARE_PROVIDER_SITE_OTHER): Payer: 59 | Admitting: *Deleted

## 2020-06-23 DIAGNOSIS — Z91038 Other insect allergy status: Secondary | ICD-10-CM | POA: Diagnosis not present

## 2020-06-23 DIAGNOSIS — T63441D Toxic effect of venom of bees, accidental (unintentional), subsequent encounter: Secondary | ICD-10-CM

## 2020-06-30 ENCOUNTER — Ambulatory Visit (INDEPENDENT_AMBULATORY_CARE_PROVIDER_SITE_OTHER): Payer: 59 | Admitting: *Deleted

## 2020-06-30 ENCOUNTER — Other Ambulatory Visit: Payer: Self-pay

## 2020-06-30 DIAGNOSIS — Z91038 Other insect allergy status: Secondary | ICD-10-CM

## 2020-06-30 DIAGNOSIS — T63441D Toxic effect of venom of bees, accidental (unintentional), subsequent encounter: Secondary | ICD-10-CM

## 2020-07-07 ENCOUNTER — Ambulatory Visit (INDEPENDENT_AMBULATORY_CARE_PROVIDER_SITE_OTHER): Payer: 59 | Admitting: *Deleted

## 2020-07-07 DIAGNOSIS — Z91038 Other insect allergy status: Secondary | ICD-10-CM | POA: Diagnosis not present

## 2020-07-11 ENCOUNTER — Other Ambulatory Visit: Payer: Self-pay | Admitting: Allergy

## 2020-07-14 ENCOUNTER — Ambulatory Visit (INDEPENDENT_AMBULATORY_CARE_PROVIDER_SITE_OTHER): Payer: 59 | Admitting: *Deleted

## 2020-07-14 DIAGNOSIS — T63441D Toxic effect of venom of bees, accidental (unintentional), subsequent encounter: Secondary | ICD-10-CM | POA: Diagnosis not present

## 2020-07-15 ENCOUNTER — Other Ambulatory Visit: Payer: 59 | Admitting: Internal Medicine

## 2020-07-15 ENCOUNTER — Other Ambulatory Visit: Payer: Self-pay

## 2020-07-15 DIAGNOSIS — E1169 Type 2 diabetes mellitus with other specified complication: Secondary | ICD-10-CM

## 2020-07-15 DIAGNOSIS — E785 Hyperlipidemia, unspecified: Secondary | ICD-10-CM

## 2020-07-15 DIAGNOSIS — E119 Type 2 diabetes mellitus without complications: Secondary | ICD-10-CM

## 2020-07-16 ENCOUNTER — Ambulatory Visit (INDEPENDENT_AMBULATORY_CARE_PROVIDER_SITE_OTHER): Payer: 59 | Admitting: Internal Medicine

## 2020-07-16 ENCOUNTER — Encounter: Payer: Self-pay | Admitting: Internal Medicine

## 2020-07-16 ENCOUNTER — Ambulatory Visit: Payer: 59 | Admitting: Internal Medicine

## 2020-07-16 VITALS — BP 110/70 | HR 80 | Ht 75.0 in | Wt 249.0 lb

## 2020-07-16 DIAGNOSIS — E785 Hyperlipidemia, unspecified: Secondary | ICD-10-CM

## 2020-07-16 DIAGNOSIS — E1169 Type 2 diabetes mellitus with other specified complication: Secondary | ICD-10-CM | POA: Diagnosis not present

## 2020-07-16 LAB — LIPID PANEL
Cholesterol: 144 mg/dL (ref <200)
HDL: 40 mg/dL (ref >=40)
LDL Cholesterol (Calc): 74 mg/dL (calc)
Non-HDL Cholesterol (Calc): 104 mg/dL (calc) (ref <130)
Total CHOL/HDL Ratio: 3.6 (calc) (ref <5.0)
Triglycerides: 210 mg/dL — ABNORMAL HIGH (ref <150)

## 2020-07-16 LAB — HEPATIC FUNCTION PANEL
AG Ratio: 1.9 (calc) (ref 1.0–2.5)
ALT: 20 U/L (ref 9–46)
AST: 16 U/L (ref 10–35)
Albumin: 4.5 g/dL (ref 3.6–5.1)
Alkaline phosphatase (APISO): 51 U/L (ref 35–144)
Bilirubin, Direct: 0.1 mg/dL (ref 0.0–0.2)
Globulin: 2.4 g/dL (calc) (ref 1.9–3.7)
Indirect Bilirubin: 0.5 mg/dL (calc) (ref 0.2–1.2)
Total Bilirubin: 0.6 mg/dL (ref 0.2–1.2)
Total Protein: 6.9 g/dL (ref 6.1–8.1)

## 2020-07-16 LAB — HEMOGLOBIN A1C
Hgb A1c MFr Bld: 7 % of total Hgb — ABNORMAL HIGH (ref <5.7)
Mean Plasma Glucose: 154 (calc)
eAG (mmol/L): 8.5 (calc)

## 2020-07-16 MED ORDER — ATORVASTATIN CALCIUM 80 MG PO TABS: 80.0000 mg | ORAL_TABLET | Freq: Every day | ORAL | 3 refills | Status: DC

## 2020-07-16 NOTE — Patient Instructions (Signed)
Increase Lipitor to 80 mg daily.  Follow-up in February with hemoglobin A1c lipid panel and liver functions.  Try to find time to exercise.  Continue to watch diet.

## 2020-07-16 NOTE — Progress Notes (Signed)
   Subjective:    Patient ID: Jerry Arroyo, male    DOB: 1965-12-26, 54 y.o.   MRN: [id number]  HPI  54 year old Male for 3 month follow up hyperlipidemia and type 2 diabetes mellitus.  Not able to get much exercise.  Works 8 AM to 5 PM and gets an hour for work.  Exercise plan discussed.  At last visit triglycerides were elevated at 194 and are now 2 TN.  Total cholesterol and LDL cholesterol are normal.  We are increasing statin medication to 80 mg daily and follow-up in February.  With regard to liver functions on statin, these are normal.  Hemoglobin A1c on Tradjenta 5 mg daily and Metformin 1000 mg twice daily is 7% it was 7.1% in August.  Has been as low as 6.2% in August 2020.  Needs to get some exercising on a daily basis.  Not able to do that right now but needs to find some time to do that.  May need to consider endocrinology consultation or adding another medication in addition to Magnet and Metformin.  We will reevaluate in February.  Has had flu vaccine in October  Tetanus immunization is up-to-date  He has bee sting allergy and is undergoing desensitization with allergist.  Needs screening colonoscopy-never had one  Third Covid vaccine received.  BMI 31.12 would like to see BMI less than 30  Review of Systems no new complaints     Objective:   Physical Exam Blood pressure 110/70 pulse 80 regular pulse oximetry 96% weight 249 pounds BMI 31.12  Skin warm and dry.  No cervical adenopathy.  Chest clear to auscultation.  Cardiac exam regular rate and rhythm normal S1 and S2 without murmurs or gallops.       Assessment & Plan:  Type 2 diabetes mellitus-trying to get hemoglobin A1c less than 7 if possible.  Continue to work on diet exercise.  Follow-up in February.  Hyperlipidemia-trying to get triglycerides closer to 150.  Increase Lipitor to 80 mg daily and follow-up in February.

## 2020-07-21 ENCOUNTER — Ambulatory Visit (INDEPENDENT_AMBULATORY_CARE_PROVIDER_SITE_OTHER): Payer: 59

## 2020-07-21 DIAGNOSIS — T63441D Toxic effect of venom of bees, accidental (unintentional), subsequent encounter: Secondary | ICD-10-CM

## 2020-07-28 ENCOUNTER — Other Ambulatory Visit: Payer: Self-pay

## 2020-07-28 ENCOUNTER — Ambulatory Visit (INDEPENDENT_AMBULATORY_CARE_PROVIDER_SITE_OTHER): Payer: 59 | Admitting: *Deleted

## 2020-07-28 DIAGNOSIS — T63441D Toxic effect of venom of bees, accidental (unintentional), subsequent encounter: Secondary | ICD-10-CM

## 2020-08-04 ENCOUNTER — Other Ambulatory Visit: Payer: Self-pay

## 2020-08-04 ENCOUNTER — Ambulatory Visit (INDEPENDENT_AMBULATORY_CARE_PROVIDER_SITE_OTHER): Payer: 59 | Admitting: *Deleted

## 2020-08-04 DIAGNOSIS — T63441D Toxic effect of venom of bees, accidental (unintentional), subsequent encounter: Secondary | ICD-10-CM | POA: Diagnosis not present

## 2020-08-11 ENCOUNTER — Other Ambulatory Visit: Payer: Self-pay

## 2020-08-11 ENCOUNTER — Ambulatory Visit (INDEPENDENT_AMBULATORY_CARE_PROVIDER_SITE_OTHER): Payer: 59 | Admitting: *Deleted

## 2020-08-11 DIAGNOSIS — T63441D Toxic effect of venom of bees, accidental (unintentional), subsequent encounter: Secondary | ICD-10-CM

## 2020-08-18 ENCOUNTER — Ambulatory Visit: Payer: Self-pay

## 2020-08-25 ENCOUNTER — Ambulatory Visit (INDEPENDENT_AMBULATORY_CARE_PROVIDER_SITE_OTHER): Payer: 59

## 2020-08-25 DIAGNOSIS — T63441D Toxic effect of venom of bees, accidental (unintentional), subsequent encounter: Secondary | ICD-10-CM | POA: Diagnosis not present

## 2020-09-01 ENCOUNTER — Ambulatory Visit (INDEPENDENT_AMBULATORY_CARE_PROVIDER_SITE_OTHER): Payer: 59

## 2020-09-01 DIAGNOSIS — T63441D Toxic effect of venom of bees, accidental (unintentional), subsequent encounter: Secondary | ICD-10-CM | POA: Diagnosis not present

## 2020-09-08 ENCOUNTER — Ambulatory Visit (INDEPENDENT_AMBULATORY_CARE_PROVIDER_SITE_OTHER): Payer: 59

## 2020-09-08 DIAGNOSIS — T63441D Toxic effect of venom of bees, accidental (unintentional), subsequent encounter: Secondary | ICD-10-CM | POA: Diagnosis not present

## 2020-09-15 ENCOUNTER — Ambulatory Visit: Payer: Self-pay

## 2020-09-22 ENCOUNTER — Ambulatory Visit: Payer: Self-pay

## 2020-09-29 ENCOUNTER — Ambulatory Visit (INDEPENDENT_AMBULATORY_CARE_PROVIDER_SITE_OTHER): Payer: 59

## 2020-09-29 DIAGNOSIS — T63441D Toxic effect of venom of bees, accidental (unintentional), subsequent encounter: Secondary | ICD-10-CM

## 2020-10-01 ENCOUNTER — Other Ambulatory Visit: Payer: Self-pay

## 2020-10-01 MED ORDER — ALPRAZOLAM 0.5 MG PO TABS: 0.5000 mg | ORAL_TABLET | Freq: Two times a day (BID) | ORAL | 2 refills | Status: DC | PRN

## 2020-10-01 NOTE — Telephone Encounter (Signed)
Patient called to request a refill on ALPRAZOLAM. Has 3 month follow up next month.

## 2020-10-06 ENCOUNTER — Ambulatory Visit (INDEPENDENT_AMBULATORY_CARE_PROVIDER_SITE_OTHER): Payer: 59

## 2020-10-06 ENCOUNTER — Other Ambulatory Visit: Payer: Self-pay

## 2020-10-06 DIAGNOSIS — T63441D Toxic effect of venom of bees, accidental (unintentional), subsequent encounter: Secondary | ICD-10-CM

## 2020-10-13 ENCOUNTER — Ambulatory Visit (INDEPENDENT_AMBULATORY_CARE_PROVIDER_SITE_OTHER): Payer: 59 | Admitting: *Deleted

## 2020-10-13 DIAGNOSIS — T63441D Toxic effect of venom of bees, accidental (unintentional), subsequent encounter: Secondary | ICD-10-CM | POA: Diagnosis not present

## 2020-10-20 ENCOUNTER — Ambulatory Visit (INDEPENDENT_AMBULATORY_CARE_PROVIDER_SITE_OTHER): Payer: 59

## 2020-10-20 ENCOUNTER — Other Ambulatory Visit: Payer: Self-pay

## 2020-10-20 DIAGNOSIS — T63441D Toxic effect of venom of bees, accidental (unintentional), subsequent encounter: Secondary | ICD-10-CM | POA: Diagnosis not present

## 2020-10-22 ENCOUNTER — Other Ambulatory Visit: Payer: 59 | Admitting: Internal Medicine

## 2020-10-25 ENCOUNTER — Ambulatory Visit: Payer: 59 | Admitting: Internal Medicine

## 2020-10-27 ENCOUNTER — Ambulatory Visit (INDEPENDENT_AMBULATORY_CARE_PROVIDER_SITE_OTHER): Payer: 59 | Admitting: *Deleted

## 2020-10-27 DIAGNOSIS — T63441D Toxic effect of venom of bees, accidental (unintentional), subsequent encounter: Secondary | ICD-10-CM

## 2020-11-03 ENCOUNTER — Other Ambulatory Visit: Payer: Self-pay

## 2020-11-03 ENCOUNTER — Ambulatory Visit (INDEPENDENT_AMBULATORY_CARE_PROVIDER_SITE_OTHER): Payer: 59 | Admitting: *Deleted

## 2020-11-03 DIAGNOSIS — T63441D Toxic effect of venom of bees, accidental (unintentional), subsequent encounter: Secondary | ICD-10-CM | POA: Diagnosis not present

## 2020-11-10 ENCOUNTER — Ambulatory Visit (INDEPENDENT_AMBULATORY_CARE_PROVIDER_SITE_OTHER): Payer: 59

## 2020-11-10 DIAGNOSIS — T63441D Toxic effect of venom of bees, accidental (unintentional), subsequent encounter: Secondary | ICD-10-CM

## 2020-11-17 ENCOUNTER — Ambulatory Visit: Payer: Self-pay

## 2020-11-22 ENCOUNTER — Other Ambulatory Visit: Payer: 59 | Admitting: Internal Medicine

## 2020-11-22 ENCOUNTER — Other Ambulatory Visit: Payer: Self-pay

## 2020-11-22 DIAGNOSIS — E119 Type 2 diabetes mellitus without complications: Secondary | ICD-10-CM

## 2020-11-22 DIAGNOSIS — E785 Hyperlipidemia, unspecified: Secondary | ICD-10-CM

## 2020-11-22 DIAGNOSIS — E1169 Type 2 diabetes mellitus with other specified complication: Secondary | ICD-10-CM

## 2020-11-23 ENCOUNTER — Encounter: Payer: Self-pay | Admitting: Internal Medicine

## 2020-11-23 ENCOUNTER — Ambulatory Visit (INDEPENDENT_AMBULATORY_CARE_PROVIDER_SITE_OTHER): Payer: 59 | Admitting: Internal Medicine

## 2020-11-23 VITALS — BP 120/80 | HR 75 | Ht 75.0 in | Wt 244.0 lb

## 2020-11-23 DIAGNOSIS — F439 Reaction to severe stress, unspecified: Secondary | ICD-10-CM | POA: Diagnosis not present

## 2020-11-23 DIAGNOSIS — M7918 Myalgia, other site: Secondary | ICD-10-CM

## 2020-11-23 DIAGNOSIS — E1169 Type 2 diabetes mellitus with other specified complication: Secondary | ICD-10-CM

## 2020-11-23 DIAGNOSIS — E785 Hyperlipidemia, unspecified: Secondary | ICD-10-CM | POA: Diagnosis not present

## 2020-11-23 LAB — HEPATIC FUNCTION PANEL
AG Ratio: 1.6 (calc) (ref 1.0–2.5)
ALT: 16 U/L (ref 9–46)
AST: 13 U/L (ref 10–35)
Albumin: 4.4 g/dL (ref 3.6–5.1)
Alkaline phosphatase (APISO): 51 U/L (ref 35–144)
Bilirubin, Direct: 0.1 mg/dL (ref 0.0–0.2)
Globulin: 2.7 g/dL (calc) (ref 1.9–3.7)
Indirect Bilirubin: 0.6 mg/dL (calc) (ref 0.2–1.2)
Total Bilirubin: 0.7 mg/dL (ref 0.2–1.2)
Total Protein: 7.1 g/dL (ref 6.1–8.1)

## 2020-11-23 LAB — HEMOGLOBIN A1C
Hgb A1c MFr Bld: 6.9 % of total Hgb — ABNORMAL HIGH (ref <5.7)
Mean Plasma Glucose: 151 mg/dL
eAG (mmol/L): 8.4 mmol/L

## 2020-11-23 LAB — LIPID PANEL
Cholesterol: 140 mg/dL (ref <200)
HDL: 44 mg/dL (ref >=40)
LDL Cholesterol (Calc): 73 mg/dL (calc)
Non-HDL Cholesterol (Calc): 96 mg/dL (calc) (ref <130)
Total CHOL/HDL Ratio: 3.2 (calc) (ref <5.0)
Triglycerides: 148 mg/dL (ref <150)

## 2020-11-23 MED ORDER — OXYCODONE-ACETAMINOPHEN 10-325 MG PO TABS: 1.0000 | ORAL_TABLET | Freq: Three times a day (TID) | ORAL | 0 refills | Status: AC | PRN

## 2020-11-23 NOTE — Progress Notes (Signed)
   Subjective:    Patient ID: Jerry Arroyo, male    DOB: [birth year], 55 y.o.   MRN: [id number]  HPI  55 year old Male seen for follow up of medical isues. He took care of his elderly father-in-law for a number of days while father-in-law was at home under [place] recently. Father-in-law had metastatic cancer and passed away. [name] lived there for a number of days performing nursing duties as needed and continued to work remotely with [employer].  He has had some musculoskeletal pain and I have given him #15 tabs of Oxycodone APAP 10/35 to take sparing as needed. Likely fatigued from stressful situation he was under as well.  His lipid panel is normal on statin. Liver functions are normal on statin and Hgb AIC is stable and improved at 6.9%.    Review of Systems no new complaints     Objective:   Physical Exam BP 120/80, pulse 75 pulse ox 75% weight 244 pounds, Ht. 34ft 3 in.  Skin warm and dry; no thyromegaly or carotid bruits.  Chest clear to auscultation.  Cardiac exam: Regular rate and rhythm normal S1 and S2 without murmurs or gallops.  No lower extremity pitting edema.       Assessment & Plan:  Type 2 diabetes mellitus stable on Metformin 1000 mg twice daily and Tradjenta 5 mg daily.  Hyperlipidemia treated with Lipitor 80 mg daily and stable  Musculoskeletal pain-given small quality of oxycodone APAP 10/325 to take sparingly on a as needed basis  Anxiety-Xanax 0.5 mg twice daily as needed on a as needed basis  Plan: His health maintenance exam is due in August and that has been booked.  Reminded about diabetic eye exam.

## 2020-11-23 NOTE — Patient Instructions (Signed)
We are sorry to hear about your father-in-law's passing.  Continue current diabetic medications and Lipitor.  Given a small quantity of oxycodone APAP 10/325 to take sparingly on an as-needed basis.  Return in August for health maintenance exam and fasting labs.  Have diabetic eye exam.

## 2020-11-24 ENCOUNTER — Ambulatory Visit (INDEPENDENT_AMBULATORY_CARE_PROVIDER_SITE_OTHER): Payer: 59

## 2020-11-24 DIAGNOSIS — T63441D Toxic effect of venom of bees, accidental (unintentional), subsequent encounter: Secondary | ICD-10-CM | POA: Diagnosis not present

## 2020-11-30 ENCOUNTER — Telehealth: Payer: Self-pay | Admitting: Internal Medicine

## 2020-11-30 NOTE — Telephone Encounter (Signed)
Pt called back and said that him and Dr Renold Genta had talked about a pain clinic referral and he had declined at that time but said he changed his mind and asked if it could be put in and just asked for a call back on if it can or cant

## 2020-11-30 NOTE — Telephone Encounter (Signed)
Book appt to discuss this after April 1st.

## 2020-11-30 NOTE — Telephone Encounter (Signed)
Scheduled

## 2020-12-01 ENCOUNTER — Ambulatory Visit (INDEPENDENT_AMBULATORY_CARE_PROVIDER_SITE_OTHER): Payer: 59 | Admitting: *Deleted

## 2020-12-01 DIAGNOSIS — T63441D Toxic effect of venom of bees, accidental (unintentional), subsequent encounter: Secondary | ICD-10-CM | POA: Diagnosis not present

## 2020-12-08 ENCOUNTER — Ambulatory Visit (INDEPENDENT_AMBULATORY_CARE_PROVIDER_SITE_OTHER): Payer: 59

## 2020-12-08 DIAGNOSIS — T63441D Toxic effect of venom of bees, accidental (unintentional), subsequent encounter: Secondary | ICD-10-CM

## 2020-12-15 ENCOUNTER — Ambulatory Visit (INDEPENDENT_AMBULATORY_CARE_PROVIDER_SITE_OTHER): Payer: 59

## 2020-12-15 DIAGNOSIS — T63441D Toxic effect of venom of bees, accidental (unintentional), subsequent encounter: Secondary | ICD-10-CM | POA: Diagnosis not present

## 2020-12-21 ENCOUNTER — Encounter: Payer: Self-pay | Admitting: Internal Medicine

## 2020-12-21 ENCOUNTER — Ambulatory Visit (INDEPENDENT_AMBULATORY_CARE_PROVIDER_SITE_OTHER): Payer: 59 | Admitting: Internal Medicine

## 2020-12-21 ENCOUNTER — Other Ambulatory Visit: Payer: Self-pay

## 2020-12-21 VITALS — BP 110/70 | HR 78 | Ht 75.0 in | Wt 244.0 lb

## 2020-12-21 DIAGNOSIS — R52 Pain, unspecified: Secondary | ICD-10-CM

## 2020-12-21 MED ORDER — HYDROCODONE-ACETAMINOPHEN 10-325 MG PO TABS: 1.0000 | ORAL_TABLET | Freq: Three times a day (TID) | ORAL | 0 refills | Status: AC | PRN
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
## 2020-12-22 ENCOUNTER — Ambulatory Visit: Payer: Self-pay

## 2020-12-22 ENCOUNTER — Ambulatory Visit (INDEPENDENT_AMBULATORY_CARE_PROVIDER_SITE_OTHER): Payer: 59

## 2020-12-22 DIAGNOSIS — J309 Allergic rhinitis, unspecified: Secondary | ICD-10-CM

## 2020-12-22 DIAGNOSIS — T63441D Toxic effect of venom of bees, accidental (unintentional), subsequent encounter: Secondary | ICD-10-CM | POA: Diagnosis not present

## 2020-12-27 NOTE — Progress Notes (Signed)
Left message

## 2020-12-29 ENCOUNTER — Ambulatory Visit
Admission: RE | Admit: 2020-12-29 | Discharge: 2020-12-29 | Disposition: A | Discharge status: Home / Self Care | Payer: 59 | Source: Ambulatory Visit | Attending: Internal Medicine | Admitting: Internal Medicine

## 2020-12-29 ENCOUNTER — Other Ambulatory Visit: Payer: Self-pay

## 2020-12-29 ENCOUNTER — Ambulatory Visit (INDEPENDENT_AMBULATORY_CARE_PROVIDER_SITE_OTHER): Payer: 59

## 2020-12-29 DIAGNOSIS — T63441D Toxic effect of venom of bees, accidental (unintentional), subsequent encounter: Secondary | ICD-10-CM | POA: Diagnosis not present

## 2021-01-03 ENCOUNTER — Telehealth: Payer: Self-pay | Admitting: Internal Medicine

## 2021-01-03 NOTE — Telephone Encounter (Signed)
Jerry Arroyo [phone]  [name] called to inquire about X-Ray results from last week on his Right knee and low back.

## 2021-01-03 NOTE — Telephone Encounter (Signed)
Mild degenerative changes in back and evidence of a prior injury on knee film.  Results are available on Epic. He should have some arthritis studies done next including sed rate, ANA, CCP, Rheumatoid factor, looking for various types of arthritis. Then, I think he should see a Rheumatologist first before heading straight to pain management. I think pain management will expect that. Please arrange lab only for these tests. Once we get those tests  back, we can make arrangements for Rheumatology consult. Pain management is the last stop in the evaluation, I think  having reconsidered all of this.

## 2021-01-04 NOTE — Telephone Encounter (Signed)
Scheduled

## 2021-01-05 ENCOUNTER — Ambulatory Visit (INDEPENDENT_AMBULATORY_CARE_PROVIDER_SITE_OTHER): Payer: 59

## 2021-01-05 DIAGNOSIS — T63441D Toxic effect of venom of bees, accidental (unintentional), subsequent encounter: Secondary | ICD-10-CM | POA: Diagnosis not present

## 2021-01-10 ENCOUNTER — Other Ambulatory Visit: Payer: Self-pay

## 2021-01-10 ENCOUNTER — Other Ambulatory Visit: Payer: 59 | Admitting: Internal Medicine

## 2021-01-10 ENCOUNTER — Telehealth: Payer: Self-pay | Admitting: Internal Medicine

## 2021-01-10 DIAGNOSIS — M7918 Myalgia, other site: Secondary | ICD-10-CM

## 2021-01-10 MED ORDER — OXYCODONE-ACETAMINOPHEN 10-325 MG PO TABS: ORAL_TABLET | ORAL | 0 refills | Status: DC

## 2021-01-10 NOTE — Telephone Encounter (Signed)
LVM to CB, was going to ask if he is out of Oxycodone also.

## 2021-01-10 NOTE — Telephone Encounter (Signed)
Jerry Arroyo [phone]  When Jerry Arroyo come in for his labs he ask to see if he could get a refill on his Hydrocodone, he said he is still having pain in his knees and back.  [id number] - [place], [place] - [place] Phone:  [phone]  Fax:  [phone]

## 2021-01-10 NOTE — Telephone Encounter (Signed)
Patient would like Oxycodone it works best for him, he is aware you will fill it this one time only. He knows we will refer him to Rheumatology.

## 2021-01-10 NOTE — Telephone Encounter (Signed)
I am refilling Oxycodone 10/325 (#15 tabs) to use sparingly for pain. Rheumatoid arthritis studies have been obtained. He will  be referred to Rheumatology for joint pain. I cannot refill this med again in the near future.

## 2021-01-10 NOTE — Addendum Note (Signed)
Addended by: Elby Showers on: 01/10/2021 03:33 PM   Modules accepted: Orders

## 2021-01-10 NOTE — Telephone Encounter (Signed)
It has only been 3 weeks since we refilled it. He was to have cut it in half and take it sparingly. Prior to that he had Rx for Oxycodone. I suppose he is out of that also? We want him to see Rheumatology as soon as these labs are back but can't put in referral until these labs are done. It should be [facility].

## 2021-01-11 LAB — RHEUMATOID FACTOR: Rheumatoid fact SerPl-aCnc: <14 IU/mL (ref <14)

## 2021-01-11 LAB — SEDIMENTATION RATE: Sed Rate: 2 mm/h (ref 0–20)

## 2021-01-11 LAB — ANA: Anti Nuclear Antibody (ANA): NEGATIVE

## 2021-01-11 LAB — CYCLIC CITRUL PEPTIDE ANTIBODY, IGG: Cyclic Citrullin Peptide Ab: <16 UNITS

## 2021-01-17 ENCOUNTER — Other Ambulatory Visit: Payer: Self-pay

## 2021-01-17 DIAGNOSIS — M2550 Pain in unspecified joint: Secondary | ICD-10-CM

## 2021-01-17 NOTE — Patient Instructions (Addendum)
You are being referred to Rheumatologist for multi-joint pain. Take Pain medication sparingly.   CPE scheduled for August.

## 2021-01-19 ENCOUNTER — Ambulatory Visit (INDEPENDENT_AMBULATORY_CARE_PROVIDER_SITE_OTHER): Payer: 59 | Admitting: Allergy

## 2021-01-19 ENCOUNTER — Other Ambulatory Visit: Payer: Self-pay

## 2021-01-19 DIAGNOSIS — T63441D Toxic effect of venom of bees, accidental (unintentional), subsequent encounter: Secondary | ICD-10-CM | POA: Diagnosis not present

## 2021-02-09 ENCOUNTER — Ambulatory Visit: Payer: Self-pay

## 2021-02-17 ENCOUNTER — Other Ambulatory Visit: Payer: Self-pay | Admitting: Internal Medicine

## 2021-03-09 ENCOUNTER — Ambulatory Visit: Payer: Self-pay

## 2021-04-18 ENCOUNTER — Other Ambulatory Visit: Payer: Self-pay

## 2021-04-18 ENCOUNTER — Other Ambulatory Visit: Payer: 59 | Admitting: Internal Medicine

## 2021-04-18 DIAGNOSIS — F439 Reaction to severe stress, unspecified: Secondary | ICD-10-CM

## 2021-04-18 DIAGNOSIS — F411 Generalized anxiety disorder: Secondary | ICD-10-CM

## 2021-04-18 DIAGNOSIS — E1169 Type 2 diabetes mellitus with other specified complication: Secondary | ICD-10-CM

## 2021-04-18 DIAGNOSIS — Z0000 Encounter for general adult medical examination without abnormal findings: Secondary | ICD-10-CM

## 2021-04-18 DIAGNOSIS — E119 Type 2 diabetes mellitus without complications: Secondary | ICD-10-CM

## 2021-04-18 DIAGNOSIS — M7918 Myalgia, other site: Secondary | ICD-10-CM

## 2021-04-18 DIAGNOSIS — H8109 Meniere's disease, unspecified ear: Secondary | ICD-10-CM

## 2021-04-18 DIAGNOSIS — G609 Hereditary and idiopathic neuropathy, unspecified: Secondary | ICD-10-CM

## 2021-04-18 DIAGNOSIS — E782 Mixed hyperlipidemia: Secondary | ICD-10-CM

## 2021-04-18 DIAGNOSIS — E785 Hyperlipidemia, unspecified: Secondary | ICD-10-CM

## 2021-04-19 ENCOUNTER — Ambulatory Visit (INDEPENDENT_AMBULATORY_CARE_PROVIDER_SITE_OTHER): Payer: 59 | Admitting: Internal Medicine

## 2021-04-19 ENCOUNTER — Encounter: Payer: Self-pay | Admitting: Internal Medicine

## 2021-04-19 VITALS — BP 120/80 | HR 80 | Ht 75.0 in | Wt 248.0 lb

## 2021-04-19 DIAGNOSIS — F411 Generalized anxiety disorder: Secondary | ICD-10-CM | POA: Diagnosis not present

## 2021-04-19 DIAGNOSIS — E119 Type 2 diabetes mellitus without complications: Secondary | ICD-10-CM

## 2021-04-19 DIAGNOSIS — M159 Polyosteoarthritis, unspecified: Secondary | ICD-10-CM | POA: Diagnosis not present

## 2021-04-19 DIAGNOSIS — M7732 Calcaneal spur, left foot: Secondary | ICD-10-CM

## 2021-04-19 DIAGNOSIS — E1169 Type 2 diabetes mellitus with other specified complication: Secondary | ICD-10-CM | POA: Diagnosis not present

## 2021-04-19 DIAGNOSIS — G609 Hereditary and idiopathic neuropathy, unspecified: Secondary | ICD-10-CM | POA: Diagnosis not present

## 2021-04-19 DIAGNOSIS — Z87442 Personal history of urinary calculi: Secondary | ICD-10-CM

## 2021-04-19 DIAGNOSIS — Z8659 Personal history of other mental and behavioral disorders: Secondary | ICD-10-CM | POA: Insufficient documentation

## 2021-04-19 DIAGNOSIS — E785 Hyperlipidemia, unspecified: Secondary | ICD-10-CM

## 2021-04-19 DIAGNOSIS — M5136 Other intervertebral disc degeneration, lumbar region: Secondary | ICD-10-CM | POA: Insufficient documentation

## 2021-04-19 DIAGNOSIS — Z0000 Encounter for general adult medical examination without abnormal findings: Secondary | ICD-10-CM | POA: Diagnosis not present

## 2021-04-19 DIAGNOSIS — G629 Polyneuropathy, unspecified: Secondary | ICD-10-CM | POA: Insufficient documentation

## 2021-04-19 DIAGNOSIS — E1142 Type 2 diabetes mellitus with diabetic polyneuropathy: Secondary | ICD-10-CM | POA: Insufficient documentation

## 2021-04-19 LAB — POCT URINALYSIS DIPSTICK
Appearance: NEGATIVE
Bilirubin, UA: NEGATIVE
Blood, UA: NEGATIVE
Glucose, UA: POSITIVE — AB
Ketones, UA: NEGATIVE
Leukocytes, UA: NEGATIVE
Nitrite, UA: NEGATIVE
Odor: NEGATIVE
Protein, UA: NEGATIVE
Spec Grav, UA: 1.025 (ref 1.010–1.025)
Urobilinogen, UA: 0.2 E.U./dL
pH, UA: 6 (ref 5.0–8.0)

## 2021-04-19 LAB — CBC WITH DIFFERENTIAL/PLATELET
Absolute Monocytes: 590 cells/uL (ref 200–950)
Basophils Absolute: 40 cells/uL (ref 0–200)
Basophils Relative: 0.6 %
Eosinophils Absolute: 147 cells/uL (ref 15–500)
Eosinophils Relative: 2.2 %
HCT: 40.8 % (ref 38.5–50.0)
Hemoglobin: 13.6 g/dL (ref 13.2–17.1)
Lymphs Abs: 2064 cells/uL (ref 850–3900)
MCH: 30 pg (ref 27.0–33.0)
MCHC: 33.3 g/dL (ref 32.0–36.0)
MCV: 90.1 fL (ref 80.0–100.0)
MPV: 10.2 fL (ref 7.5–12.5)
Monocytes Relative: 8.8 %
Neutro Abs: 3859 cells/uL (ref 1500–7800)
Neutrophils Relative %: 57.6 %
Platelets: 253 10*3/uL (ref 140–400)
RBC: 4.53 10*6/uL (ref 4.20–5.80)
RDW: 13.6 % (ref 11.0–15.0)
Total Lymphocyte: 30.8 %
WBC: 6.7 10*3/uL (ref 3.8–10.8)

## 2021-04-19 LAB — HEMOGLOBIN A1C
Hgb A1c MFr Bld: 7.5 % of total Hgb — ABNORMAL HIGH (ref <5.7)
Mean Plasma Glucose: 169 mg/dL
eAG (mmol/L): 9.3 mmol/L

## 2021-04-19 LAB — COMPLETE METABOLIC PANEL WITH GFR
AG Ratio: 1.7 (calc) (ref 1.0–2.5)
ALT: 25 U/L (ref 9–46)
AST: 18 U/L (ref 10–35)
Albumin: 4.5 g/dL (ref 3.6–5.1)
Alkaline phosphatase (APISO): 51 U/L (ref 35–144)
BUN/Creatinine Ratio: 25 (calc) — ABNORMAL HIGH (ref 6–22)
BUN: 15 mg/dL (ref 7–25)
CO2: 27 mmol/L (ref 20–32)
Calcium: 9.1 mg/dL (ref 8.6–10.3)
Chloride: 106 mmol/L (ref 98–110)
Creat: 0.6 mg/dL — ABNORMAL LOW (ref 0.70–1.30)
Globulin: 2.6 g/dL (calc) (ref 1.9–3.7)
Glucose, Bld: 141 mg/dL — ABNORMAL HIGH (ref 65–99)
Potassium: 4.2 mmol/L (ref 3.5–5.3)
Sodium: 142 mmol/L (ref 135–146)
Total Bilirubin: 0.6 mg/dL (ref 0.2–1.2)
Total Protein: 7.1 g/dL (ref 6.1–8.1)
eGFR: 113 mL/min/{1.73_m2} (ref >=60)

## 2021-04-19 LAB — LIPID PANEL
Cholesterol: 153 mg/dL (ref <200)
HDL: 42 mg/dL (ref >=40)
LDL Cholesterol (Calc): 89 mg/dL (calc)
Non-HDL Cholesterol (Calc): 111 mg/dL (calc) (ref <130)
Total CHOL/HDL Ratio: 3.6 (calc) (ref <5.0)
Triglycerides: 119 mg/dL (ref <150)

## 2021-04-19 LAB — MICROALBUMIN / CREATININE URINE RATIO
Creatinine, Urine: 197 mg/dL (ref 20–320)
Microalb Creat Ratio: 9 mcg/mg creat (ref <30)
Microalb, Ur: 1.8 mg/dL

## 2021-04-19 LAB — PSA: PSA: 0.75 ng/mL (ref <=4.00)

## 2021-04-19 MED ORDER — ALPRAZOLAM 0.5 MG PO TABS: 0.5000 mg | ORAL_TABLET | Freq: Two times a day (BID) | ORAL | 2 refills | Status: AC | PRN

## 2021-04-19 MED ORDER — GABAPENTIN 300 MG PO CAPS: 300.0000 mg | ORAL_CAPSULE | Freq: Every day | ORAL | 3 refills | Status: DC

## 2021-04-19 NOTE — Patient Instructions (Addendum)
Please watch diet and exercise. RTC 3 months for Hgb AIC and OV.  Continue current medications.  Follow-up in September with Rheumatologist

## 2021-04-19 NOTE — Progress Notes (Signed)
Subjective:    Patient ID: Jerry Arroyo, male    DOB: 1966-08-16, 55 y.o.   MRN: [id number]  HPI 55 year old Male for health maintenance exam and evaluation of medical issues. Hgb AIC has increased to 7.5%.  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  However he does have a sedentary position with [place] [facility] with telehealth.  He is on Tradjenta 5 mg daily and metformin 1000 mg twice daily with meals.  History of calcium oxalate kidney stones with no recent episodes.  Had left lithotripsy in 2007.  Followed at [facility] urology.  Takes potassiums citrate.  History of Mnire's disease diagnosed by Dr. Idelle Crouch at [facility] and treated with HCTZ.  This appears to be stable.  History of anxiety treated with Xanax on an as-needed basis.  History of paresthesias in his feet for which he takes Neurontin.  He takes Lipitor for hyperlipidemia.  Had an allergic reaction to bee sting in 2021 subsequently had venom testing by allergist and is undergoing immunotherapy.  Has EpiPen.  History of Herpes zoster May 2019.  History of left foot plantar fasciitis seen by Dr. Gershon Mussel.  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  He is married.  Wife is an Automotive engineer.  2 daughters, 1 adult daughter who is married.  Another daughter has finished school and is attending [employer].  He does not smoke or consume alcohol.  Left inguinal hernia repair 1993, right inguinal hernia repair 1998.  History of peptic ulcer disease at age 81.  History of hypertriglyceridemia.  In [id number] he saw Dr. Erik Obey for Mnire's disease transferring from [facility] care.  He was having issues with tinnitus.  Tympanograms were normal on each side.  He was diagnosed with noise-induced hearing loss and tinnitus.  Dr. Erik Obey really did not think patient had Mnire's disease at that time.  Pure-tone audiometry showed normal hearing  with a tiny high-frequency drop-off on each side.  Seen at [facility] in June for musculoskeletal pain diagnosed with degenerative lumbar disc disease, osteoarthritis (generalized) and spur left foot.  Was started on meloxicam.  Has follow-up visit in September.   Unfortunately hemoglobin A1c is increased from 6.9% in March to 7.5%.  In November 2021 it was 7%.  Highest A1c was 8% in February 2020.  We discussed importance of diet exercise and managing weight.  We will continue with current regimen but patient will return in November for hemoglobin A1c and office visit.  He may need Endocrinology consultation.   Review of Systems no new complaints.  Need screening colonoscopy.     Objective:   Physical Exam Skin warm and dry.  Nodes none.  TMs clear.  Neck is supple without JVD thyromegaly or carotid bruits.  Chest is clear to auscultation.  Cardiac exam: Regular rate and rhythm.  Abdomen soft nondistended without hepatosplenomegaly masses or tenderness.  Prostate is normal without nodules.  No lower extremity pitting edema.  Affect thought and judgment are normal.  Neurological exam is intact without focal deficits.       Assessment & Plan:   Type 2 diabetes mellitus with hemoglobin A1c 7.5% despite being on Tradjenta and metformin.  Follow-up in 3 months and consider endocrinology consultation if not improving.  Watch diet and get more exercise.  History of degenerative joint disease seen at [facility] rheumatology with follow-up in September.  History of generalized osteoarthritis  seen by [facility] and treated with meloxicam  History of left heel spur  History of left foot plantar fasciitis  Hyperlipidemia treated with Lipitor  Bee sting allergy-currently undergoing immunotherapy  Peripheral neuropathy in feet treated with Neurontin  History of anxiety treated with Xanax taken on an as-needed basis sparingly  History of kidney stones followed at  urology  Plan: Follow-up in 3 months with office visit and hemoglobin A1c.  Work on diet and exercise.  Follow-up with rheumatologist in September.  May need endocrinology consultation if hemoglobin A1c is not improving

## 2021-04-20 ENCOUNTER — Other Ambulatory Visit: Payer: Self-pay | Admitting: Internal Medicine

## 2021-04-28 IMAGING — CR DG LUMBAR SPINE COMPLETE 4+V
5 series · 5 of 5 positions shown · non-contrast
Comparison: None.

CLINICAL DATA: Chronic lower back and left leg pain without known
injury.

EXAM:
LUMBAR SPINE - COMPLETE 4+ VIEW

[t lumbar spine ap]
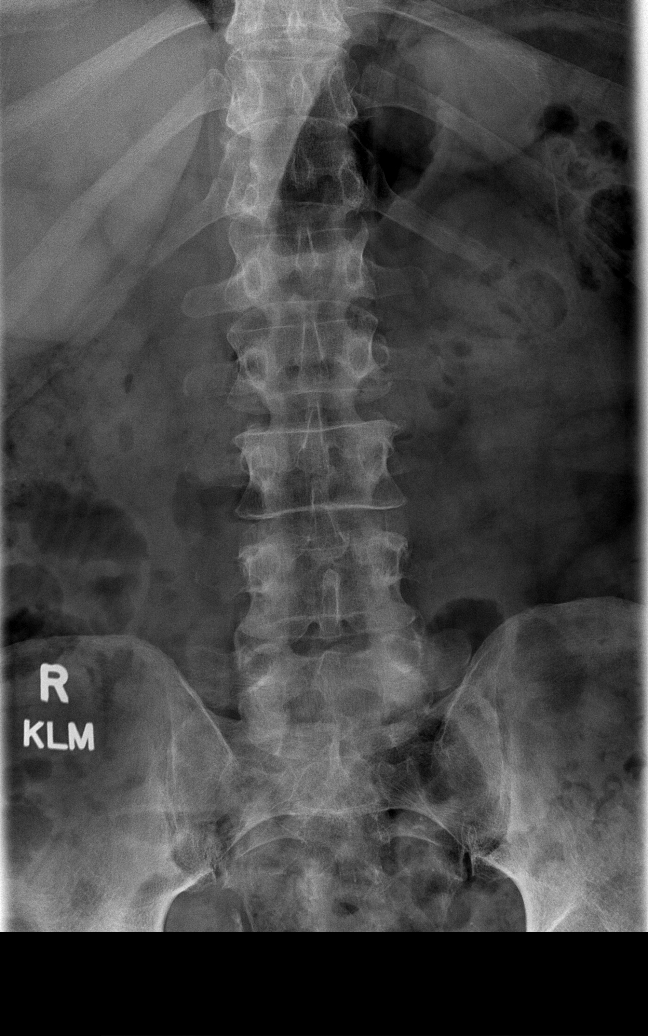

[t lumbar spine obl (1 of 2)]
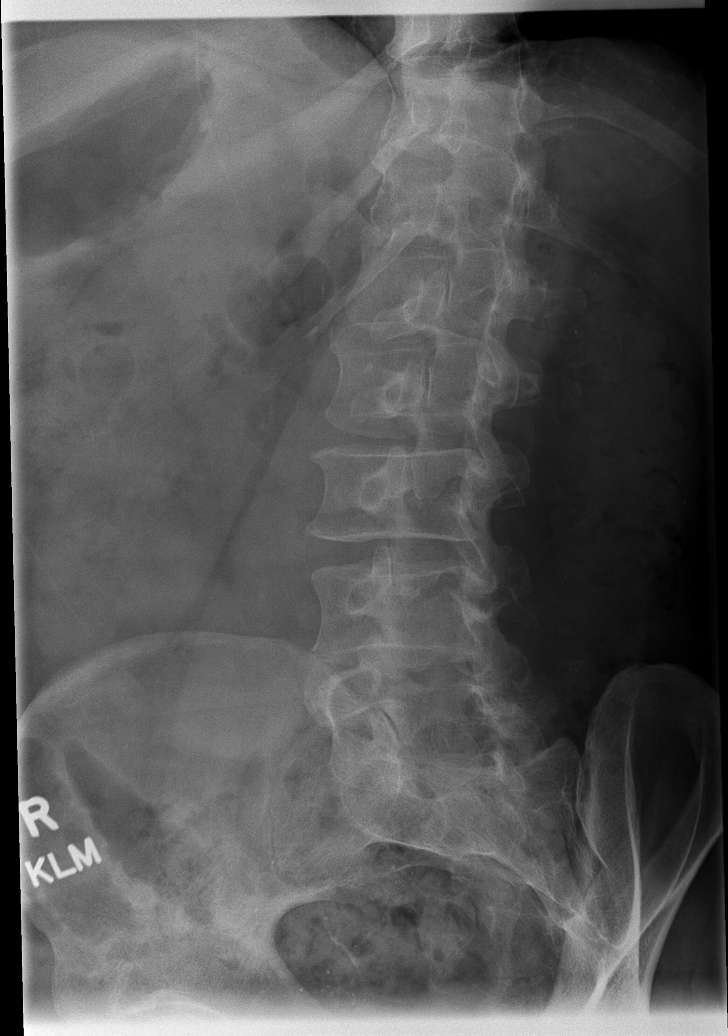

[t lumbar spine obl (2 of 2)]
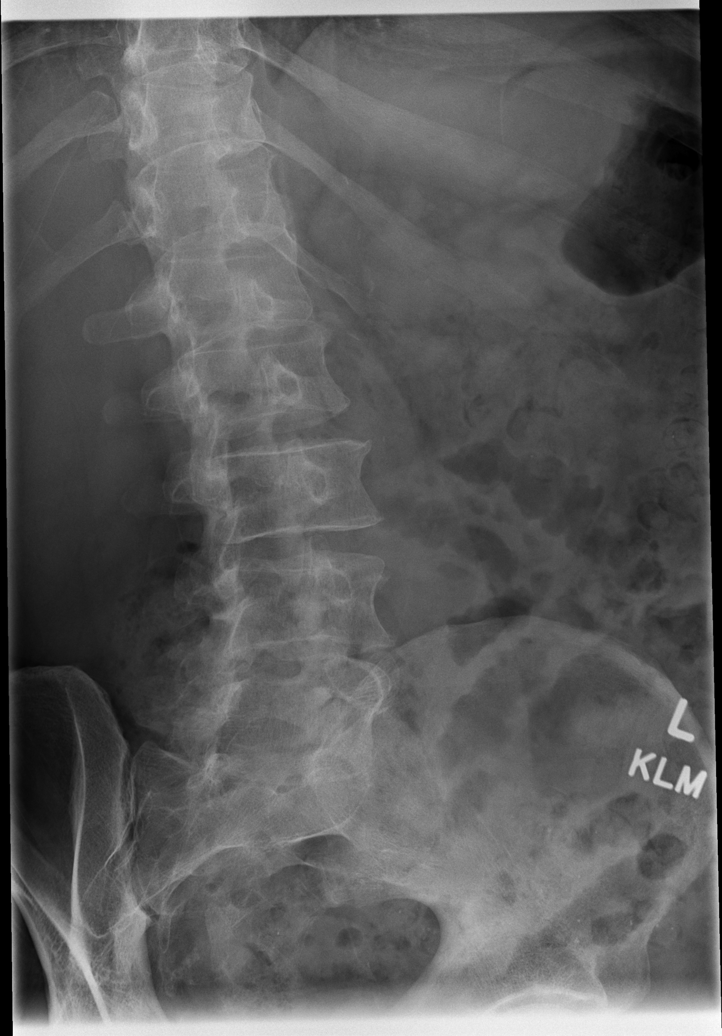

[t lumbar spine lat]
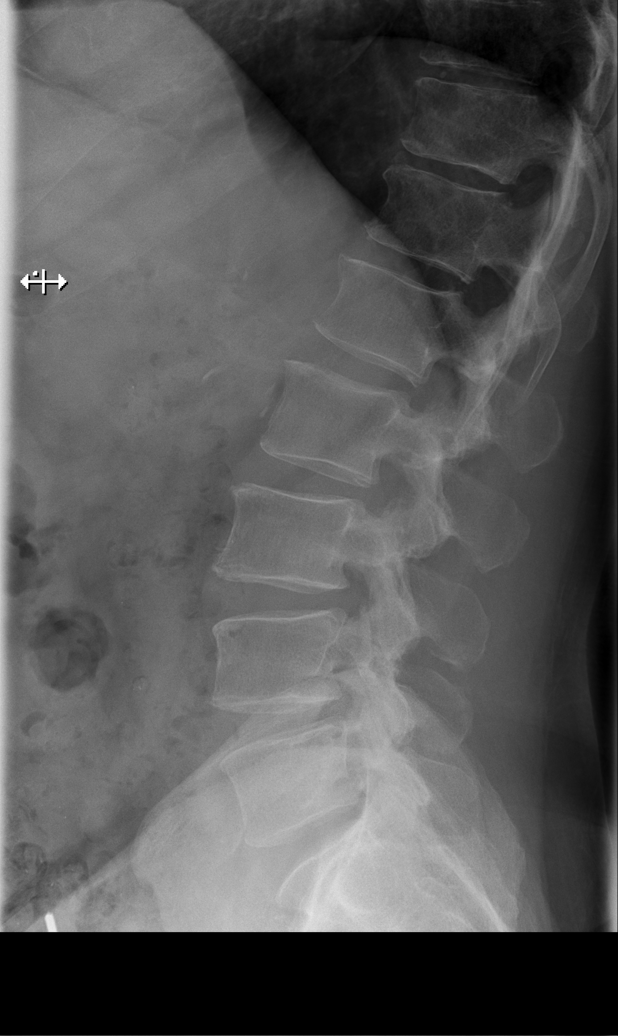

[t lumbar l-5 s-1 spot]
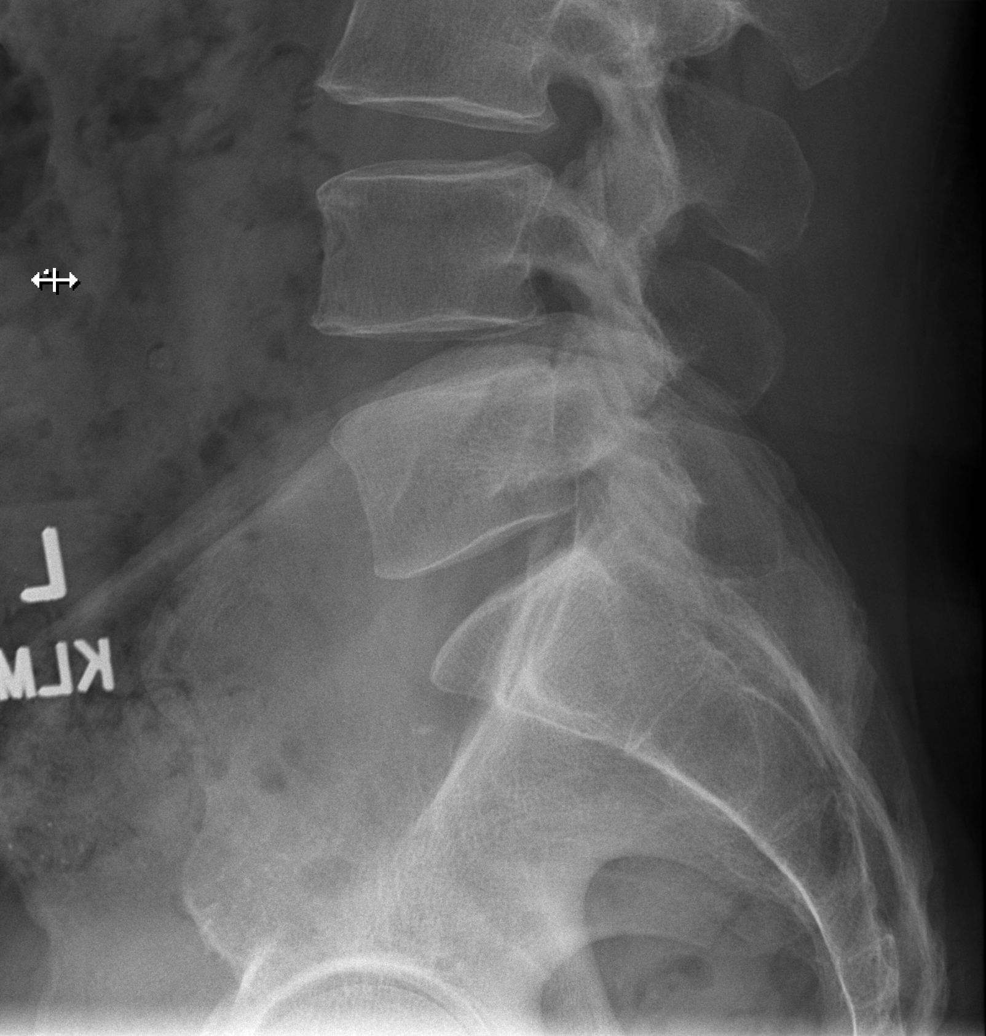

[5 of 5 positions shown; findings below may reference images not displayed]

FINDINGS: No fracture or spondylolisthesis is noted. Disc spaces are
well-maintained. Minimal anterior osteophyte formation is noted at
L2-3 and L3-4.
IMPRESSION: Minimal degenerative changes are noted. No acute abnormality seen in
the lumbar spine.

## 2021-04-28 IMAGING — CR DG KNEE COMPLETE 4+V*R*
4 series · 4 of 4 positions shown · non-contrast
Comparison: None.

CLINICAL DATA: Chronic right knee pain for 10 years.

EXAM:
RIGHT KNEE - COMPLETE 4+ VIEW

[w knee ap right]
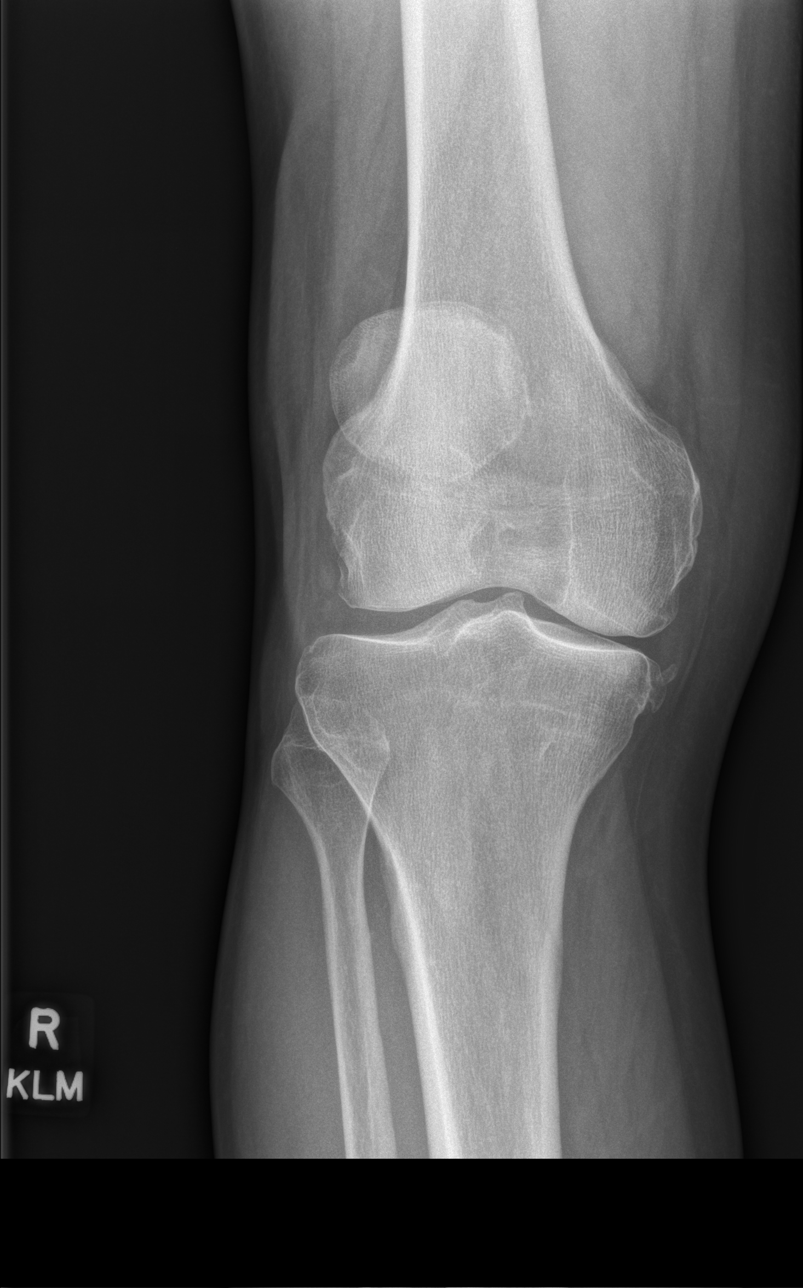

[w knee lat right]
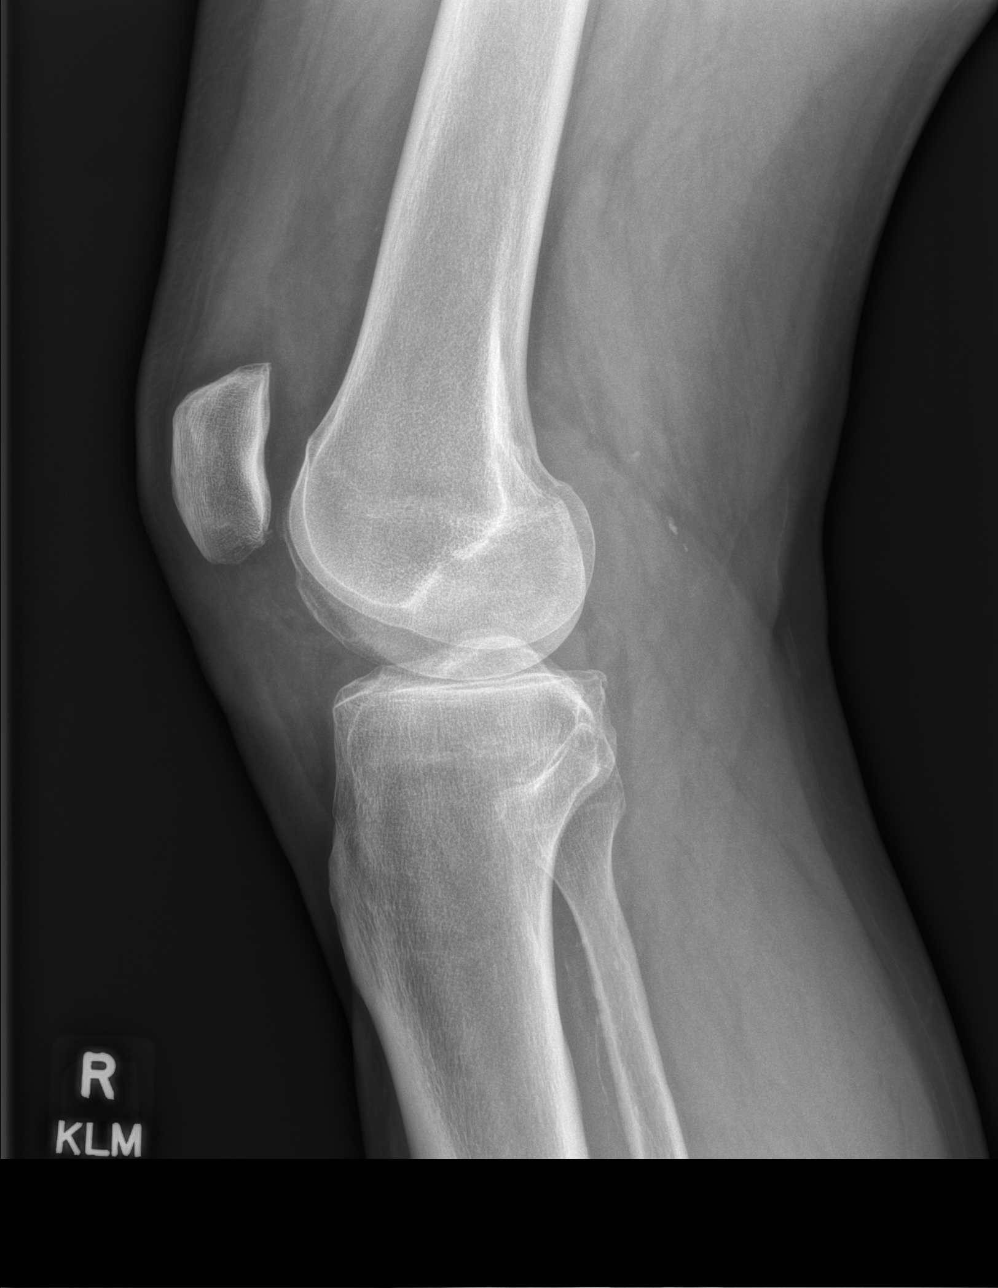

[w knee tunnel pa right]
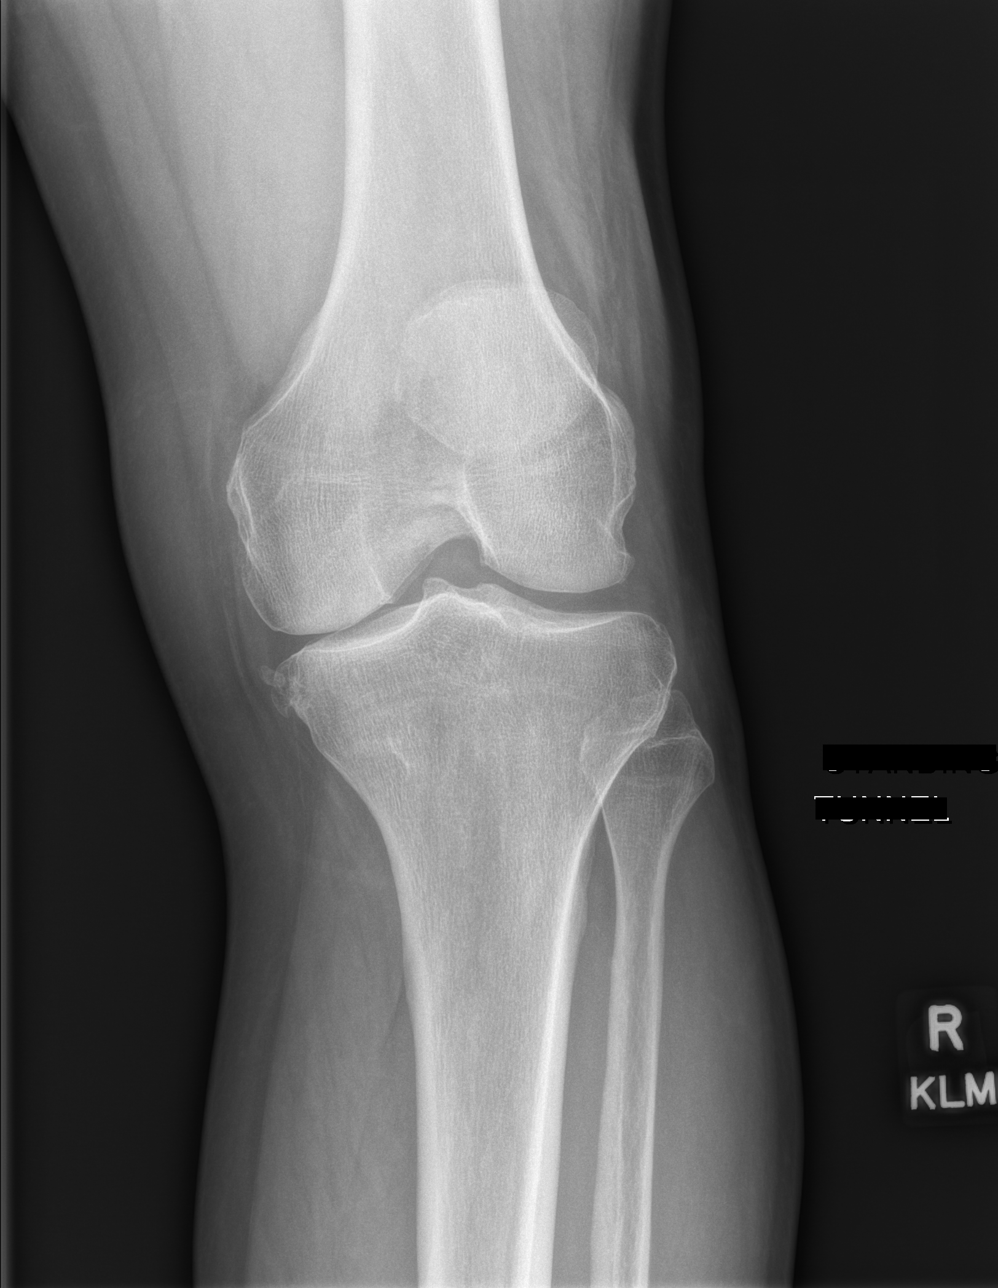

[x knee sunrise right]
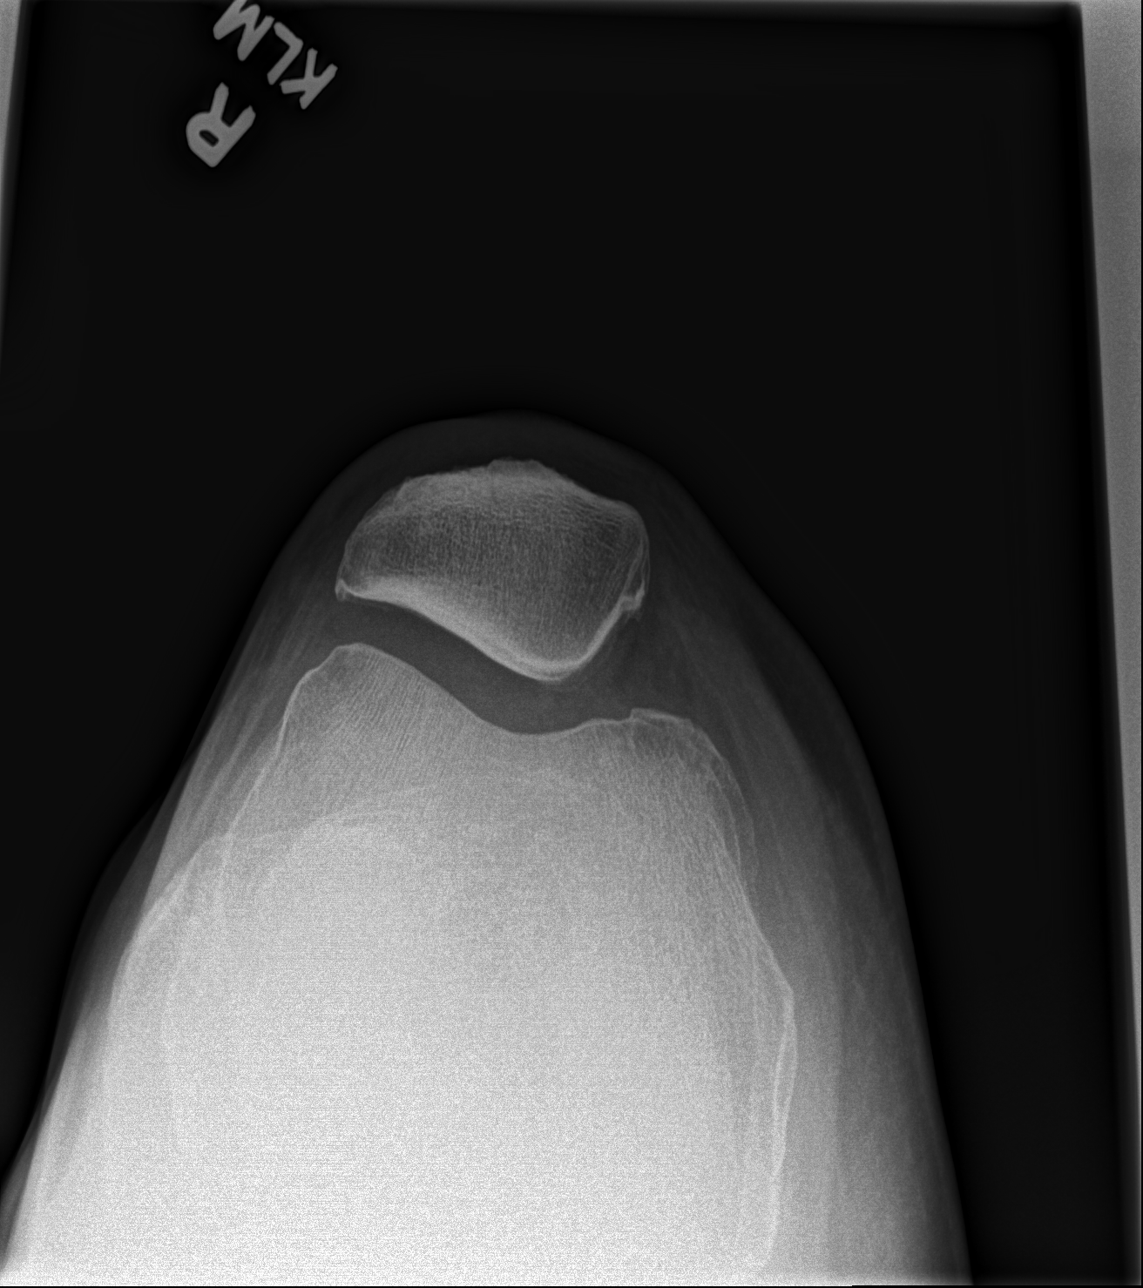

[4 of 4 positions shown; findings below may reference images not displayed]

FINDINGS: Irregularity of the medial tibial plateau cortex likely due to prior
injury to the medial collateral ligament.

No acute fracture or dislocation. Joint spaces are maintained. Soft
tissues are unremarkable.
IMPRESSION: Findings suggestive of prior medial collateral ligament injury.

## 2021-05-20 ENCOUNTER — Other Ambulatory Visit: Payer: Self-pay | Admitting: Internal Medicine

## 2021-07-14 ENCOUNTER — Other Ambulatory Visit: Payer: 59 | Admitting: Internal Medicine

## 2021-07-14 ENCOUNTER — Other Ambulatory Visit: Payer: Self-pay

## 2021-07-14 DIAGNOSIS — E119 Type 2 diabetes mellitus without complications: Secondary | ICD-10-CM

## 2021-07-15 LAB — HEMOGLOBIN A1C
Hgb A1c MFr Bld: 7.1 % of total Hgb — ABNORMAL HIGH (ref <5.7)
Mean Plasma Glucose: 157 mg/dL
eAG (mmol/L): 8.7 mmol/L

## 2021-07-18 ENCOUNTER — Other Ambulatory Visit: Payer: Self-pay

## 2021-07-18 ENCOUNTER — Encounter: Payer: Self-pay | Admitting: Internal Medicine

## 2021-07-18 ENCOUNTER — Ambulatory Visit (INDEPENDENT_AMBULATORY_CARE_PROVIDER_SITE_OTHER): Payer: 59 | Admitting: Internal Medicine

## 2021-07-18 VITALS — BP 112/68 | HR 78 | Temp 98.0°F | Resp 16 | Ht 75.0 in | Wt 241.0 lb

## 2021-07-18 DIAGNOSIS — K580 Irritable bowel syndrome with diarrhea: Secondary | ICD-10-CM | POA: Diagnosis not present

## 2021-07-18 DIAGNOSIS — E119 Type 2 diabetes mellitus without complications: Secondary | ICD-10-CM | POA: Diagnosis not present

## 2021-07-18 DIAGNOSIS — Z1213 Encounter for screening for malignant neoplasm of small intestine: Secondary | ICD-10-CM

## 2021-07-18 DIAGNOSIS — Z1211 Encounter for screening for malignant neoplasm of colon: Secondary | ICD-10-CM

## 2021-07-18 DIAGNOSIS — E1169 Type 2 diabetes mellitus with other specified complication: Secondary | ICD-10-CM | POA: Diagnosis not present

## 2021-07-18 DIAGNOSIS — E785 Hyperlipidemia, unspecified: Secondary | ICD-10-CM

## 2021-07-18 MED ORDER — DICYCLOMINE HCL 20 MG PO TABS: 20.0000 mg | ORAL_TABLET | Freq: Three times a day (TID) | ORAL | 1 refills | Status: DC

## 2021-07-18 NOTE — Progress Notes (Signed)
   Subjective:    Patient ID: Jerry Arroyo, male    DOB: [birth year], 55 y.o.   MRN: [id number]  HPI 55 year old  Male seen for follow up on Type 2 Diabetes mellitus.  He was seen in August for health maintenance exam.  At that time hemoglobin A1c had increased to 7.5%.  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  Currently on Tradjenta 5 mg daily and metformin 1000 mg twice daily with meals.  He also has hyperlipidemia treated with Lipitor.  In August at time of health maintenance exam lipid panel was normal.  Hemoglobin A1c checked on November 10 to 7.1% and previously was 7.5% in August.  In August 2020 his hemoglobin A1c was 6.2%, 6.7% in February 2021, 7.1% in August 2021, 7% in November 2021 and 6.9% in March 2022.  I would like to see hemoglobin A1c less than 7 if possible.  Dietary issues reviewed.  I think he could benefit from Endocrinology consultation.    Review of Systems     Objective:   Physical Exam Blood pressure 112/68 pulse 78 respiratory rate 16 temperature 98 degrees pulse oximetry 96% weight 241 pounds BMI 30.12  He is in no acute distress.  We have discussed.  He is interested in trying new medications but I would like to have Endocrinology input with regard to choice of new medication       Assessment & Plan:  Type 2 diabetes mellitus currently treated with Tradjenta 5 mg daily and metformin 1000 mg twice daily  Hyperlipidemia treated with Lipitor and lipid panel recently was normal  Peripheral neuropathy treated with Neurontin  History of Mnire's disease  Allergic rhinitis  Osteoarthritis seen by [facility] rheumatology and treated with meloxicam  Bee sting allergy currently undergoing immunotherapy  Anxiety treated with Xanax  History of kidney stones followed at urology  Irritable bowel syndrome-has  frequent diarrhea particularly after eating breakfast.  Prescribed Bentyl 20 mg 1/2-hour before meals as needed.  Plan: Endocrinology referral regarding consultation for choice of best diabetic medication for him.  Patient requests Cologuard rather than colonoscopy.  Order placed for Cologuard.

## 2021-07-18 NOTE — Patient Instructions (Addendum)
Referral to Endcocrinology for med optimization.  Cologuard order placed.  Return in August for health maintenance exam.  Have prescribed Bentyl for irritable bowel symptoms.

## 2021-08-06 LAB — COLOGUARD: COLOGUARD: POSITIVE — AB

## 2021-08-09 ENCOUNTER — Encounter: Payer: Self-pay | Admitting: Gastroenterology

## 2021-09-20 ENCOUNTER — Ambulatory Visit (AMBULATORY_SURGERY_CENTER): Payer: 59

## 2021-09-20 VITALS — Ht 75.0 in | Wt 250.0 lb

## 2021-09-20 DIAGNOSIS — Z1211 Encounter for screening for malignant neoplasm of colon: Secondary | ICD-10-CM

## 2021-09-20 DIAGNOSIS — T7840XA Allergy, unspecified, initial encounter: Secondary | ICD-10-CM

## 2021-09-20 HISTORY — DX: Allergy, unspecified, initial encounter: T78.40XA

## 2021-09-20 MED ORDER — NA SULFATE-K SULFATE-MG SULF 17.5-3.13-1.6 GM/177ML PO SOLN: 1.0000 | Freq: Once | ORAL | 0 refills | Status: AC

## 2021-09-20 NOTE — Progress Notes (Signed)
No egg or soy allergy known to patient  No issues known to pt with past sedation with any surgeries or procedures Patient denies ever being told they had issues or difficulty with intubation  No FH of Malignant Hyperthermia Pt is not on diet pills Pt is not on  home 02  Pt is not on blood thinners  Pt denies issues with constipation  No A fib or A flutter  Pt is fully vaccinated  for Covid   NO PA's for preps discussed with pt In PV today  Discussed with pt there will be an out-of-pocket cost for prep and that varies from $0 to 70 +  dollars - pt verbalized understanding   Due to the COVID-19 pandemic we are asking patients to follow certain guidelines in PV and the [place]   Pt aware of COVID protocols and [facility] guidelines   PV completed over the phone. Pt verified name, DOB, address and insurance during PV today.  Pt mailed instruction packet with copy of consent form to read and not return, and instructions.  Pt encouraged to call with questions or issues.   procedure instructions sent via My Chart

## 2021-09-21 ENCOUNTER — Encounter: Payer: Self-pay | Admitting: Gastroenterology

## 2021-09-28 ENCOUNTER — Other Ambulatory Visit: Payer: Self-pay

## 2021-09-28 ENCOUNTER — Encounter: Payer: Self-pay | Admitting: Gastroenterology

## 2021-09-28 ENCOUNTER — Ambulatory Visit (AMBULATORY_SURGERY_CENTER): Payer: 59 | Admitting: Gastroenterology

## 2021-09-28 VITALS — BP 127/91 | HR 69 | Temp 98.0°F | Resp 21 | Ht 75.0 in | Wt 250.0 lb

## 2021-09-28 DIAGNOSIS — D127 Benign neoplasm of rectosigmoid junction: Secondary | ICD-10-CM

## 2021-09-28 DIAGNOSIS — D122 Benign neoplasm of ascending colon: Secondary | ICD-10-CM | POA: Diagnosis not present

## 2021-09-28 DIAGNOSIS — D123 Benign neoplasm of transverse colon: Secondary | ICD-10-CM

## 2021-09-28 DIAGNOSIS — D120 Benign neoplasm of cecum: Secondary | ICD-10-CM | POA: Diagnosis not present

## 2021-09-28 DIAGNOSIS — R195 Other fecal abnormalities: Secondary | ICD-10-CM | POA: Diagnosis present

## 2021-09-28 MED ORDER — SODIUM CHLORIDE 0.9 % IV SOLN: 500.0000 mL | Freq: Once | INTRAVENOUS | Status: DC

## 2021-09-28 NOTE — Progress Notes (Signed)
Pt awake, report to RN, VVS  °

## 2021-09-28 NOTE — Patient Instructions (Signed)
Handouts on polyps, diverticulosis, and hemorrhoids given.  Await pathology results.    YOU HAD AN ENDOSCOPIC PROCEDURE TODAY AT [facility]:   Refer to the procedure report that was given to you for any specific questions about what was found during the examination.  If the procedure report does not answer your questions, please call your gastroenterologist to clarify.  If you requested that your care partner not be given the details of your procedure findings, then the procedure report has been included in a sealed envelope for you to review at your convenience later.  YOU SHOULD EXPECT: Some feelings of bloating in the abdomen. Passage of more gas than usual.  Walking can help get rid of the air that was put into your GI tract during the procedure and reduce the bloating. If you had a lower endoscopy (such as a colonoscopy or flexible sigmoidoscopy) you may notice spotting of blood in your stool or on the toilet paper. If you underwent a bowel prep for your procedure, you may not have a normal bowel movement for a few days.  Please Note:  You might notice some irritation and congestion in your nose or some drainage.  This is from the oxygen used during your procedure.  There is no need for concern and it should clear up in a day or so.  SYMPTOMS TO REPORT IMMEDIATELY:  Following lower endoscopy (colonoscopy or flexible sigmoidoscopy):  Excessive amounts of blood in the stool  Significant tenderness or worsening of abdominal pains  Swelling of the abdomen that is new, acute  Fever of 100F or higher  For urgent or emergent issues, a gastroenterologist can be reached at any hour by calling [phone]. Do not use MyChart messaging for urgent concerns.    DIET:  We do recommend a small meal at first, but then you may proceed to your regular diet.  Drink plenty of fluids but you should avoid alcoholic beverages for 24 hours.  ACTIVITY:  You should plan to take it easy for  the rest of today and you should NOT DRIVE or use heavy machinery until tomorrow (because of the sedation medicines used during the test).    FOLLOW UP: Our staff will call the number listed on your records 48-72 hours following your procedure to check on you and address any questions or concerns that you may have regarding the information given to you following your procedure. If we do not reach you, we will leave a message.  We will attempt to reach you two times.  During this call, we will ask if you have developed any symptoms of COVID 19. If you develop any symptoms (ie: fever, flu-like symptoms, shortness of breath, cough etc.) before then, please call [phone].  If you test positive for Covid 19 in the 2 weeks post procedure, please call and report this information to [place].    If any biopsies were taken you will be contacted by phone or by letter within the next 1-3 weeks.  Please call us at [phone] if you have not heard about the biopsies in 3 weeks.    SIGNATURES/CONFIDENTIALITY: You and/or your care partner have signed paperwork which will be entered into your electronic medical record.  These signatures attest to the fact that that the information above on your After Visit Summary has been reviewed and is understood.  Full responsibility of the confidentiality of this discharge information lies with you and/or your care-partner.

## 2021-09-28 NOTE — Progress Notes (Signed)
Called to room to assist during endoscopic procedure.  Patient ID and intended procedure confirmed with present staff. Received instructions for my participation in the procedure from the performing physician.  

## 2021-09-28 NOTE — Progress Notes (Signed)
[facility] History and Physical   Primary Care Physician:  Elby Showers, MD   Reason for Procedure:   (+) cologuard  Plan:    colonoscopy     HPI: Jerry Arroyo is a 56 y.o. male  here for colonoscopy screening due to positive cologuard test. Patient denies any significant bowel symptoms at this time - has occasional loose stool. Grandmother had colon cancer, no other family members with colon cancer. Otherwise feels well without any cardiopulmonary symptoms. Have discussed risks / benefits of colonoscopy and anesthesia with him and he wishes to proceed.   Past Medical History:  Diagnosis Date   Allergy 09/20/2021   seasonal and environmental and Bee venom   Anxiety    Asthma    Diabetes mellitus without complication ([place])    History of kidney stones    Hyperlipidemia     Past Surgical History:  Procedure Laterality Date   ADENOIDECTOMY     HERNIA REPAIR     left and right   LAMINECTOMY AND MICRODISCECTOMY CERVICAL SPINE  2018   C6-7   TONSILLECTOMY     TYMPANOSTOMY TUBE PLACEMENT      Prior to Admission medications   Medication Sig Start Date End Date Taking? Authorizing Provider  ALPRAZolam Duanne Moron) 0.5 MG tablet Take 1 tablet (0.5 mg total) by mouth 2 (two) times daily as needed for anxiety. 04/19/21  Yes Elby Showers, MD  atorvastatin (LIPITOR) 80 MG tablet TAKE 1 TABLET BY MOUTH  DAILY 05/20/21  Yes Baxley, Cresenciano Lick, MD  Cyanocobalamin (VITAMIN B 12 PO) Take by mouth.   Yes [provider]  gabapentin (NEURONTIN) 300 MG capsule TAKE 1 CAPSULE(300 MG) BY MOUTH AT BEDTIME 04/20/21  Yes Baxley, Cresenciano Lick, MD  meloxicam (MOBIC) 15 MG tablet Take 15 mg by mouth daily. 09/08/21  Yes [provider]  metFORMIN (GLUCOPHAGE) 1000 MG tablet TAKE 1 TABLET BY MOUTH  TWICE DAILY WITH MEALS 02/18/21  Yes Baxley, Cresenciano Lick, MD  Multiple Vitamin (MULTIVITAMIN) tablet Take 1 tablet by mouth daily.   Yes [provider]  potassium citrate (UROCIT-K)  10 MEQ (1080 MG) SR tablet Take 10 mEq by mouth daily.   Yes [provider]  TRADJENTA 5 MG TABS tablet TAKE 1 TABLET BY MOUTH  DAILY 02/18/21  Yes Elby Showers, MD  albuterol (VENTOLIN HFA) 108 (90 Base) MCG/ACT inhaler INHALE 2 PUFFS INTO THE LUNGS EVERY 4 HOURS AS NEEDED FOR WHEEZING OR SHORTNESS OF BREATH 07/12/20   Garnet Sierras, DO  cetirizine (ZYRTEC) 10 MG tablet Take 10 mg by mouth daily.    [provider]  dicyclomine (BENTYL) 20 MG tablet Take 1 tablet (20 mg total) by mouth 3 (three) times daily before meals. 07/18/21   Elby Showers, MD  EPINEPHrine (EPIPEN 2-PAK) 0.3 mg/0.3 mL IJ SOAJ injection Inject 0.3 mLs (0.3 mg total) into the muscle as needed for anaphylaxis. 03/04/20   Nena Polio, MD  fexofenadine (ALLEGRA) 180 MG tablet Take 180 mg by mouth daily.    [provider]    Current Outpatient Medications  Medication Sig Dispense Refill   ALPRAZolam (XANAX) 0.5 MG tablet Take 1 tablet (0.5 mg total) by mouth 2 (two) times daily as needed for anxiety. 60 tablet 2   atorvastatin (LIPITOR) 80 MG tablet TAKE 1 TABLET BY MOUTH  DAILY 90 tablet 3   Cyanocobalamin (VITAMIN B 12 PO) Take by mouth.     gabapentin (NEURONTIN) 300 MG  capsule TAKE 1 CAPSULE(300 MG) BY MOUTH AT BEDTIME 90 capsule 3   meloxicam (MOBIC) 15 MG tablet Take 15 mg by mouth daily.     metFORMIN (GLUCOPHAGE) 1000 MG tablet TAKE 1 TABLET BY MOUTH  TWICE DAILY WITH MEALS 180 tablet 3   Multiple Vitamin (MULTIVITAMIN) tablet Take 1 tablet by mouth daily.     potassium citrate (UROCIT-K) 10 MEQ (1080 MG) SR tablet Take 10 mEq by mouth daily.     TRADJENTA 5 MG TABS tablet TAKE 1 TABLET BY MOUTH  DAILY 90 tablet 3   albuterol (VENTOLIN HFA) 108 (90 Base) MCG/ACT inhaler INHALE 2 PUFFS INTO THE LUNGS EVERY 4 HOURS AS NEEDED FOR WHEEZING OR SHORTNESS OF BREATH 18 g 3   cetirizine (ZYRTEC) 10 MG tablet Take 10 mg by mouth daily.     dicyclomine (BENTYL) 20 MG tablet Take 1 tablet (20 mg  total) by mouth 3 (three) times daily before meals. 90 tablet 1   EPINEPHrine (EPIPEN 2-PAK) 0.3 mg/0.3 mL IJ SOAJ injection Inject 0.3 mLs (0.3 mg total) into the muscle as needed for anaphylaxis. 1 each 3   fexofenadine (ALLEGRA) 180 MG tablet Take 180 mg by mouth daily.     Current Facility-Administered Medications  Medication Dose Route Frequency Provider Last Rate Last Admin   0.9 %  sodium chloride infusion  500 mL Intravenous Once Draxton Luu, Carlota Raspberry, MD        Allergies as of 09/28/2021 - Review Complete 09/28/2021  Allergen Reaction Noted   Bee venom Swelling 09/12/2011    Family History  Problem Relation Age of Onset   Healthy Mother    Allergic rhinitis Mother    Colon polyps Father    Healthy Father    Colon polyps Paternal Grandmother    Colon cancer Paternal Grandmother    Esophageal cancer Neg Hx    Stomach cancer Neg Hx    Rectal cancer Neg Hx     Social History   Socioeconomic History   Marital status: Married    Spouse name: Not on file   Number of children: Not on file   Years of education: Not on file   Highest education level: Not on file  Occupational History   Not on file  Tobacco Use   Smoking status: Never   Smokeless tobacco: Current    Types: Snuff  Vaping Use   Vaping Use: Never used  Substance and Sexual Activity   Alcohol use: Never   Drug use: Never   Sexual activity: Not on file  Other Topics Concern   Not on file  Social History Narrative   Not on file   Social Determinants of Health   Financial Resource Strain: Not on file  Food Insecurity: Not on file  Transportation Needs: Not on file  Physical Activity: Not on file  Stress: Not on file  Social Connections: Not on file  Intimate Partner Violence: Not on file    Review of Systems: All other review of systems negative except as mentioned in the HPI.  Physical Exam: Vital signs BP 129/73    Pulse 79    Temp 98 F (36.7 C)    Ht 6\' 3"  (1.905 m)    Wt 250 lb (113.4  kg)    SpO2 98%    BMI 31.25 kg/m   General:   Alert,  Well-developed, pleasant and cooperative in NAD Lungs:  Clear throughout to auscultation.   Heart:  Regular rate and rhythm Abdomen:  Soft, nontender and nondistended.   Neuro/Psych:  Alert and cooperative. Normal mood and affect. A and O x 3  Jolly Mango, MD [facility]

## 2021-09-28 NOTE — Op Note (Signed)
[facility] Patient Name: Jerry Arroyo Procedure Date: 09/28/2021 2:18 PM MRN: [id number] Endoscopist: Remo Lipps P. Havery Moros , MD Age: 56 Referring MD:  Date of Birth: [birth year] Gender: Male Account #: [id number] Procedure:                Colonoscopy Indications:              Positive Cologuard test - first colonoscopy Medicines:                Monitored Anesthesia Care Procedure:                Pre-Anesthesia Assessment:                           - Prior to the procedure, a History and Physical                            was performed, and patient medications and                            allergies were reviewed. The patient's tolerance of                            previous anesthesia was also reviewed. The risks                            and benefits of the procedure and the sedation                            options and risks were discussed with the patient.                            All questions were answered, and informed consent                            was obtained. Prior Anticoagulants: The patient has                            taken no previous anticoagulant or antiplatelet                            agents. ASA Grade Assessment: II - A patient with                            mild systemic disease. After reviewing the risks                            and benefits, the patient was deemed in                            satisfactory condition to undergo the procedure.                           After obtaining informed consent, the colonoscope  was passed under direct vision. Throughout the                            procedure, the patient's blood pressure, pulse, and                            oxygen saturations were monitored continuously. The                            Olympus Colonoscope #[id number] was introduced through                            the anus and advanced to the the cecum, identified                            by  appendiceal orifice and ileocecal valve. The                            colonoscopy was performed without difficulty. The                            patient tolerated the procedure well. The quality                            of the bowel preparation was good. The ileocecal                            valve, appendiceal orifice, and rectum were                            photographed. Scope In: 2:37:31 PM Scope Out: 3:31:50 PM Scope Withdrawal Time: 0 hours 42 minutes 31 seconds  Total Procedure Duration: 0 hours 54 minutes 19 seconds  Findings:                 A large skin tag was found on perianal exam.                           Two sessile polyps were found in the cecum. The                            polyps were 2 to 4 mm in size. These polyps were                            removed with a cold snare. Resection and retrieval                            were complete.                           A 4 mm polyp was found in the ascending colon. The                            polyp was sessile. The  polyp was removed with a                            cold snare. Resection and retrieval were complete.                           Two sessile polyps were found in the transverse                            colon. The polyps were 4 mm in size. These polyps                            were removed with a cold snare. Resection and                            retrieval were complete.                           Many medium-mouthed diverticula were found in the                            entire colon.                           A large polyp was found in the distal sigmoid                            colon. The polyp appeared to be pedunculated but                            the stalk was quite short. The stalk was                            successfully injected with 5 mL of a 1:010,000                            solution of epinephrine for drug delivery during                            cecal intubation to  shrink the polyp prior to                            polectomy attempt. Upon withdrawal the largest                            snare in our facility was used (73mm stiff snare)                            to perform polypectomy however I could not get the                            snare around the entire head of the polyp. I could  get the snare around the front side of it and                            extent down to the stalk however could not get the                            snare around the the back side of the polyp to                            perform a clean resection. I tried multiple                            positions and tried to bring the polyp down to lie                            distal from the stalk, however, the stalk is short                            and polyp continually flopped back to its starting                            position. Given inability to loop the polyp with                            the largest snare and risk for bleeding with this                            lesion, I elected not to remove it today and would                            plan on removing this in the hospital in the near                            future where we have larger snares. Biopsies were                            taken from the head with a cold forceps for                            histology to rule out malignancy. Area across the                            lumen from the polyp, and distal to it, was                            tattooed with an injection of Spot (carbon black).                           Internal hemorrhoids were found during retroflexion.  The exam was otherwise without abnormality. Complications:            No immediate complications. Estimated blood loss:                            Minimal. Estimated Blood Loss:     Estimated blood loss was minimal. Impression:               - Large perianal skin tag found  on perianal exam.                           - Two 2 to 4 mm polyps in the cecum, removed with a                            cold snare. Resected and retrieved.                           - One 4 mm polyp in the ascending colon, removed                            with a cold snare. Resected and retrieved.                           - Two 4 mm polyps in the transverse colon, removed                            with a cold snare. Resected and retrieved.                           - Diverticulosis in the entire examined colon.                           - One large polyp in the distal sigmoid colon.                            Treated as above - attempted to remove the lesion                            but given limitation in snare size this was not                            possible today. Tattooed and biopsied.                           - Internal hemorrhoids.                           - The examination was otherwise normal. Recommendation:           - Patient has a contact number available for                            emergencies. The signs and symptoms of potential  delayed complications were discussed with the                            patient. Return to normal activities tomorrow.                            Written discharge instructions were provided to the                            patient.                           - Resume previous diet.                           - Continue present medications.                           - Await pathology results. Plan for polypectomy                            attempt at the hospital at a date to be scheduled. Remo Lipps P. Ronnell Clinger, MD 09/28/2021 3:50:18 PM This report has been signed electronically.

## 2021-09-28 NOTE — Progress Notes (Signed)
Pt's states no medical or surgical changes since previsit or office visit. VS by DT. °

## 2021-09-30 ENCOUNTER — Telehealth: Payer: Self-pay | Admitting: *Deleted

## 2021-09-30 NOTE — Telephone Encounter (Signed)
°  Follow up Call-  Call back number 09/28/2021  Post procedure Call Back phone  # [phone]  Permission to leave phone message Yes  Some recent data might be hidden     Patient questions:  Do you have a fever, pain , or abdominal swelling? No. Pain Score  0 *  Have you tolerated food without any problems? Yes.    Have you been able to return to your normal activities? Yes.    Do you have any questions about your discharge instructions: Diet   No. Medications  No. Follow up visit  No.  Do you have questions or concerns about your Care? No.  Actions: * If pain score is 4 or above: No action needed, pain <4.

## 2021-10-03 ENCOUNTER — Encounter: Payer: Self-pay | Admitting: Internal Medicine

## 2021-10-03 ENCOUNTER — Other Ambulatory Visit: Payer: Self-pay

## 2021-10-03 ENCOUNTER — Ambulatory Visit (INDEPENDENT_AMBULATORY_CARE_PROVIDER_SITE_OTHER): Payer: 59 | Admitting: Internal Medicine

## 2021-10-03 VITALS — BP 128/80 | HR 78 | Ht 75.0 in | Wt 251.8 lb

## 2021-10-03 DIAGNOSIS — E1165 Type 2 diabetes mellitus with hyperglycemia: Secondary | ICD-10-CM | POA: Diagnosis not present

## 2021-10-03 DIAGNOSIS — E1142 Type 2 diabetes mellitus with diabetic polyneuropathy: Secondary | ICD-10-CM

## 2021-10-03 DIAGNOSIS — E785 Hyperlipidemia, unspecified: Secondary | ICD-10-CM

## 2021-10-03 MED ORDER — DAPAGLIFLOZIN PROPANEDIOL 5 MG PO TABS: 5.0000 mg | ORAL_TABLET | Freq: Every day | ORAL | 1 refills | Status: DC

## 2021-10-03 MED ORDER — ACCU-CHEK GUIDE VI STRP: ORAL_STRIP | 12 refills | Status: DC

## 2021-10-03 MED ORDER — ACCU-CHEK GUIDE W/DEVICE KIT: 1.0000 | PACK | Freq: Once | 0 refills | Status: AC

## 2021-10-03 MED ORDER — ACCU-CHEK SOFTCLIX LANCETS MISC: 12 refills | Status: AC

## 2021-10-03 NOTE — Patient Instructions (Addendum)
Please continue: - Metformin 1000 2x a day with meals.  Stop: - Tradjenta   Please start: - Farxiga 5 mg before b'fast  Please return in 3 months with your sugar log.
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
## 2021-10-04 ENCOUNTER — Other Ambulatory Visit: Payer: Self-pay

## 2021-10-04 DIAGNOSIS — D122 Benign neoplasm of ascending colon: Secondary | ICD-10-CM

## 2021-10-04 DIAGNOSIS — Z86010 Personal history of colonic polyps: Secondary | ICD-10-CM

## 2021-10-04 DIAGNOSIS — D127 Benign neoplasm of rectosigmoid junction: Secondary | ICD-10-CM

## 2021-10-04 DIAGNOSIS — D120 Benign neoplasm of cecum: Secondary | ICD-10-CM

## 2021-10-04 DIAGNOSIS — D123 Benign neoplasm of transverse colon: Secondary | ICD-10-CM

## 2021-10-04 MED ORDER — SUTAB 1479-225-188 MG PO TABS: 1.0000 | ORAL_TABLET | ORAL | 0 refills | Status: DC

## 2021-10-04 NOTE — Progress Notes (Signed)
Patient needs colonoscopy at [facility] for hx of large colon polyp. Scheduled for Thursday, March 2nd at 10:15 am to arrive at 8:45 am. Called patient and LM on cell phone with date and time. Asked that he call back or MyChart confirmation if this works for him. If so, instructions will be mailed and sent via [place]. Need to confirm prep. Suprep?  Patient called back and confirmed dates. Would like to try Sutab. Scheduled and sent instructions

## 2021-10-05 ENCOUNTER — Telehealth: Payer: Self-pay

## 2021-10-05 ENCOUNTER — Other Ambulatory Visit: Payer: Self-pay | Admitting: Internal Medicine

## 2021-10-05 MED ORDER — EMPAGLIFLOZIN 10 MG PO TABS: 10.0000 mg | ORAL_TABLET | Freq: Every day | ORAL | 3 refills | Status: DC

## 2021-10-05 NOTE — Telephone Encounter (Signed)
OK, I sent Jardiance.

## 2021-10-05 NOTE — Telephone Encounter (Signed)
Pt called to advise insurance will not cover [place].

## 2021-10-05 NOTE — Telephone Encounter (Signed)
LVM for pt advising alternative sent to preferred pharmacy.

## 2021-10-26 ENCOUNTER — Telehealth: Payer: Self-pay | Admitting: Gastroenterology

## 2021-10-26 ENCOUNTER — Encounter (HOSPITAL_COMMUNITY): Payer: Self-pay | Admitting: Gastroenterology

## 2021-10-26 NOTE — Telephone Encounter (Signed)
MyChart message sent to pt

## 2021-10-26 NOTE — Telephone Encounter (Signed)
Patient called wanted to follow up with you and make sure that when he goes to have his colonoscopy done at [facility] that Dr. Havery Moros also removes some skin tags they spoke about.

## 2021-10-28 ENCOUNTER — Telehealth: Payer: Self-pay

## 2021-10-28 ENCOUNTER — Ambulatory Visit (INDEPENDENT_AMBULATORY_CARE_PROVIDER_SITE_OTHER): Payer: 59 | Admitting: Internal Medicine

## 2021-10-28 ENCOUNTER — Other Ambulatory Visit: Payer: Self-pay

## 2021-10-28 ENCOUNTER — Encounter: Payer: Self-pay | Admitting: Internal Medicine

## 2021-10-28 VITALS — BP 140/88 | HR 88 | Temp 98.5°F

## 2021-10-28 DIAGNOSIS — F411 Generalized anxiety disorder: Secondary | ICD-10-CM

## 2021-10-28 MED ORDER — ESCITALOPRAM OXALATE 20 MG PO TABS: ORAL_TABLET | ORAL | 0 refills | Status: DC

## 2021-10-28 NOTE — Patient Instructions (Addendum)
Please contact counseling service on Monday for an appt. Please take one half  tab of Lexapro 20 mg daily x 3 days then one tab daily. Take Xanax 0.5mg  daily 3 times daily.

## 2021-10-28 NOTE — Telephone Encounter (Signed)
scheduled

## 2021-10-28 NOTE — Progress Notes (Signed)
° °  Subjective:    Patient ID: Jerry Arroyo, male    DOB: [birth year], 56 y.o.   MRN: [id number]  HPI Patient seen today with his wife present. It seems he became upset with his youngest daughter, Jerry Arroyo, yesterday after returning home from an Orthopedic appointment for his wife. An argument ensued when Jerry Arroyo appeared to dismiss her father when he was attempting to tell her something about wife's treatment plan. This resulted in Jerry Arroyo who is enrolled at [facility]  leaving the home and spending the night with her married sister, Jerry Arroyo. His wife is supportive. 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 Wife, who is an Automotive engineer, recently had a horseback riding accident and PT has been prescribed. Youngest daughter, Jerry Arroyo, is interested in being a physical therapist.  Patient seen today and is tearful in the office. He says he is truly sorry for upsetting Jerry Arroyo but felt he was being dismissed by her. He felt what he had to say was encouraging and he felt rejected when she motioned with her hand in a dismissive fashion.  We had some discussion today about parental expectations and how we were expected to behave as children. Briefly discussed how patient was raised. 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 He helped take care of wife's parents especially wife's Father at end of life.he has always worked very hard to provide for his family.He looked after his own parents as well.  Patient has a large polyp that needs to be removed at hospital endoscopy unit soon but is thought to be non-cancerous.Apparently, a larger snare is available at the hospital.  Review of Systems Patient is tearful and has been in distress last evening and today. He and wife spoke for a long time last evening.     Objective:   Physical Exam   Tearful Male seen in moderate distress. Thought, affect, and  judgement appear normal.  BP 140/88 pulse 88 regular pulse ox 97% T. 98.5 degrees    Assessment & Plan:   Depression  Anxiety state  Plan: Lexapro 20 mg tablets to take one half tab daily x 3 days then one tab daily. Xanax 0.5 mg up to 3 times daily for anxiety. Rest at home this weekend. Has been provided a list of offices providing medication management and counseling services in [place].  I will follow up with phone call next week.  Time spent with chart review and interview as well as e-scribing medication is 30 minutes.

## 2021-10-28 NOTE — Telephone Encounter (Signed)
Patient is requesting a call from the provider. I asked what this was in regards to and he said his mental health and he is asking for a recommendation. He would not elaborate for me and has insisted a phone call.  [phone].

## 2021-11-02 ENCOUNTER — Encounter (HOSPITAL_COMMUNITY): Payer: Self-pay | Admitting: Gastroenterology

## 2021-11-03 ENCOUNTER — Ambulatory Visit (HOSPITAL_COMMUNITY)
Admission: RE | Admit: 2021-11-03 | Discharge: 2021-11-03 | Disposition: A | Discharge status: Home / Self Care | Payer: 59 | Attending: Gastroenterology | Admitting: Gastroenterology

## 2021-11-03 ENCOUNTER — Ambulatory Visit (HOSPITAL_COMMUNITY): Payer: 59 | Admitting: Anesthesiology

## 2021-11-03 ENCOUNTER — Encounter (HOSPITAL_COMMUNITY)
Admission: RE | Disposition: A | Discharge status: Home / Self Care | Payer: Self-pay | Source: Home / Self Care | Attending: Gastroenterology

## 2021-11-03 ENCOUNTER — Other Ambulatory Visit: Payer: Self-pay

## 2021-11-03 ENCOUNTER — Ambulatory Visit (HOSPITAL_BASED_OUTPATIENT_CLINIC_OR_DEPARTMENT_OTHER): Payer: 59 | Admitting: Anesthesiology

## 2021-11-03 DIAGNOSIS — J45909 Unspecified asthma, uncomplicated: Secondary | ICD-10-CM | POA: Diagnosis not present

## 2021-11-03 DIAGNOSIS — K644 Residual hemorrhoidal skin tags: Secondary | ICD-10-CM

## 2021-11-03 DIAGNOSIS — D125 Benign neoplasm of sigmoid colon: Secondary | ICD-10-CM | POA: Diagnosis not present

## 2021-11-03 DIAGNOSIS — D127 Benign neoplasm of rectosigmoid junction: Secondary | ICD-10-CM | POA: Diagnosis not present

## 2021-11-03 DIAGNOSIS — E119 Type 2 diabetes mellitus without complications: Secondary | ICD-10-CM

## 2021-11-03 DIAGNOSIS — Z860100 Personal history of colon polyps, unspecified: Secondary | ICD-10-CM

## 2021-11-03 DIAGNOSIS — K635 Polyp of colon: Secondary | ICD-10-CM | POA: Diagnosis not present

## 2021-11-03 DIAGNOSIS — D123 Benign neoplasm of transverse colon: Secondary | ICD-10-CM

## 2021-11-03 DIAGNOSIS — Z8719 Personal history of other diseases of the digestive system: Secondary | ICD-10-CM | POA: Diagnosis not present

## 2021-11-03 DIAGNOSIS — D120 Benign neoplasm of cecum: Secondary | ICD-10-CM

## 2021-11-03 DIAGNOSIS — D122 Benign neoplasm of ascending colon: Secondary | ICD-10-CM

## 2021-11-03 HISTORY — PX: HEMOSTASIS CLIP PLACEMENT: SHX6857

## 2021-11-03 HISTORY — PX: POLYPECTOMY: SHX5525

## 2021-11-03 HISTORY — PX: SUBMUCOSAL LIFTING INJECTION: SHX6855

## 2021-11-03 HISTORY — PX: FLEXIBLE SIGMOIDOSCOPY: SHX5431

## 2021-11-03 LAB — GLUCOSE, CAPILLARY: Glucose-Capillary: 130 mg/dL — ABNORMAL HIGH (ref 70–99)

## 2021-11-03 SURGERY — POLYPECTOMY
Anesthesia: Monitor Anesthesia Care

## 2021-11-03 MED ORDER — PROPOFOL 500 MG/50ML IV EMUL
INTRAVENOUS | Status: DC | PRN
Start: 2021-11-03 — End: 2021-11-03
  Administered 2021-11-03: 40 mg via INTRAVENOUS

## 2021-11-03 MED ORDER — PROPOFOL 1000 MG/100ML IV EMUL
INTRAVENOUS | Status: AC
  Filled 2021-11-03: qty 100

## 2021-11-03 MED ORDER — ONDANSETRON HCL 4 MG/2ML IJ SOLN
INTRAMUSCULAR | Status: DC | PRN
  Administered 2021-11-03: 4 mg via INTRAVENOUS

## 2021-11-03 MED ORDER — LIDOCAINE HCL (CARDIAC) PF 100 MG/5ML IV SOSY
PREFILLED_SYRINGE | INTRAVENOUS | Status: DC | PRN
Start: 2021-11-03 — End: 2021-11-03
  Administered 2021-11-03: 50 mg via INTRAVENOUS

## 2021-11-03 MED ORDER — EPINEPHRINE 1 MG/10ML IJ SOSY
PREFILLED_SYRINGE | INTRAMUSCULAR | Status: DC | PRN
  Administered 2021-11-03: 3.5 mL

## 2021-11-03 MED ORDER — GLYCOPYRROLATE 0.2 MG/ML IJ SOLN
INTRAMUSCULAR | Status: DC | PRN
Start: 2021-11-03 — End: 2021-11-03
  Administered 2021-11-03: .2 mg via INTRAVENOUS

## 2021-11-03 MED ORDER — SODIUM CHLORIDE 0.9 % IV SOLN: INTRAVENOUS | Status: DC

## 2021-11-03 MED ORDER — PROPOFOL 500 MG/50ML IV EMUL
INTRAVENOUS | Status: DC | PRN
Start: 2021-11-03 — End: 2021-11-03
  Administered 2021-11-03: 150 ug/kg/min via INTRAVENOUS

## 2021-11-03 MED ORDER — LACTATED RINGERS IV SOLN
INTRAVENOUS | Status: DC
Start: 2021-11-03 — End: 2021-11-03

## 2021-11-03 SURGICAL SUPPLY — 22 items

## 2021-11-03 NOTE — Op Note (Signed)
[facility] ?Patient Name: Jerry Arroyo ?Procedure Date: 11/03/2021 ?MRN: [id number] ?Attending MD: Carlota Raspberry. Havery Moros , MD ?Date of Birth: [birth year] ?CSN: [id number] ?Age: 56 ?Admit Type: Outpatient ?Procedure:                Flexible Sigmoidoscopy ?Indications:              For therapy of adenomatous polyps in the colon -  ?                          large distal sigmoid polyp noted on screening in  ?                          outpatient center, here for removal at hospital  ?                          given difficult polypectomy with risks for bleeding ?Providers:                Carlota Raspberry. Havery Moros, MD, Ladoris Gene, RN,  ?                          William Dalton, Technician ?Referring MD:              ?Medicines:                Monitored Anesthesia Care ?Complications:            No immediate complications. Estimated blood loss:  ?                          Minimal. ?Estimated Blood Loss:     Estimated blood loss was minimal. ?Procedure:                Pre-Anesthesia Assessment: ?                          - Prior to the procedure, a History and Physical  ?                          was performed, and patient medications and  ?                          allergies were reviewed. The patient's tolerance of  ?                          previous anesthesia was also reviewed. The risks  ?                          and benefits of the procedure and the sedation  ?                          options and risks were discussed with the patient.  ?                          All questions were answered, and informed consent  ?  was obtained. Prior Anticoagulants: The patient has  ?                          taken no previous anticoagulant or antiplatelet  ?                          agents. ASA Grade Assessment: II - A patient with  ?                          mild systemic disease. After reviewing the risks  ?                          and benefits, the patient was deemed in  ?                           satisfactory condition to undergo the procedure. ?                          After obtaining informed consent, the scope was  ?                          passed under direct vision. The [id number]-[id number] ([id number])  ?                          Olympus endoscope was introduced through the anus  ?                          and advanced to the the sigmoid colon. The flexible  ?                          sigmoidoscopy was accomplished without difficulty.  ?                          The patient tolerated the procedure well. The  ?                          quality of the bowel preparation was good. ?Scope In: 10:17:28 AM ?Scope Out: 10:45:51 AM ?Total Procedure Duration: 0 hours 28 minutes 23 seconds  ?Findings: ?     Large skin tag was found on perianal exam. ?     The mid sigmoid colon and distal sigmoid colon appeared normal. ?     A large polyp was found in the distal sigmoid colon / rectosigmoid area.  ?     The polyp was pedunculated with a short stalk. The stalk was  ?     successfully injected with 3 mL of a 1:100,000 solution of epinephrine  ?     for drug delivery prior to polypectomy attempt. The polyp was very  ?     difficult to get the snare around the head of it in the forward  ?     position, the polyp kept flopping to an area where grasping it was quite  ?     difficult. In this light we moved to the retroflexed position with the  ?     upper endoscope. Eventually using the largest stiff  snare, the head of  ?     the polyp was able to be entirely grasped and snare placed at the base  ?     of the stalk. It was then removed with a hot snare. Resection and  ?     retrieval were complete. To prevent bleeding after the polypectomy,  ?     three hemostatic clips were successfully placed across the defect. Old  ?     tattoo in the area from previous placement. No bleeding during or at the  ?     end of the polypectomy. ?     The exam was otherwise without abnormality. ?Impression:               - Perianal skin tag  found on perianal exam. ?                          - The mid sigmoid colon and distal sigmoid colon  ?                          are normal. ?                          - One large polyp in the distal sigmoid colon,  ?                          removed with a hot snare following epinephrine  ?                          injection, in the retroflexed position using upper  ?                          endoscope as outlined. 3 clips were placed  ?                          prophylactically. ?                          - The examination was otherwise normal. ?Moderate Sedation: ?     No moderate sedation, case performed with MAC ?Recommendation:           - Discharge patient to home. ?                          - Resume previous diet. ?                          - Continue present medications. ?                          - Await pathology results. ?                          - No ibuprofen, naproxen, or other non-steroidal  ?                          anti-inflammatory drugs for 2 weeks after polyp  ?  removal. ?                          - Will refer patient to general surgery per his  ?                          request for removal of perianal skin tag ?Procedure Code(s):        --- Professional --- ?                          [phone], Sigmoidoscopy, flexible; with removal of  ?                          tumor(s), polyp(s), or other lesion(s) by snare  ?                          technique ?                          16579, Sigmoidoscopy, flexible; with directed  ?                          submucosal injection(s), any substance ?Diagnosis Code(s):        --- Professional --- ?                          K63.5, Polyp of colon ?                          K64.4, Residual hemorrhoidal skin tags ?                          D12.6, Benign neoplasm of colon, unspecified ?CPT copyright 2019 American Medical Association. All rights reserved. ?The codes documented in this report are preliminary and upon coder review may  ?be  revised to meet current compliance requirements. ?Carlota Raspberry. Havery Moros, MD ?11/03/2021 10:58:29 AM ?This report has been signed electronically. ?Number of Addenda: 0 ?

## 2021-11-03 NOTE — Transfer of Care (Signed)
Immediate Anesthesia Transfer of Care Note ? ?Patient: Jerry Arroyo ? ?Procedure(s) Performed: Procedure(s): ?COLONOSCOPY WITH PROPOFOL (FLEX SIGMOID) (N/A) ?POLYPECTOMY ?SUBMUCOSAL LIFTING INJECTION ? ?Patient Location: PACU ? ?Anesthesia Type:MAC ? ?Level of Consciousness:  sedated, patient cooperative and responds to stimulation ? ?Airway & Oxygen Therapy:Patient Spontanous Breathing and Patient connected to face mask oxgen ? ?Post-op Assessment:  Report given to PACU RN and Post -op Vital signs reviewed and stable ? ?Post vital signs:  Reviewed and stable ? ?Last Vitals:  ?Vitals:  ? 11/03/21 0925  ?BP: (!) 163/87  ?Pulse: 74  ?Resp: 12  ?Temp: 36.5 ?C  ?SpO2: 98%  ? ? ?Complications: No apparent anesthesia complications ? ?

## 2021-11-03 NOTE — Anesthesia Postprocedure Evaluation (Signed)
Anesthesia Post Note ? ?Patient: Jerry Arroyo ? ?Procedure(s) Performed: COLONOSCOPY WITH PROPOFOL (FLEX SIGMOID) ?POLYPECTOMY ?SUBMUCOSAL LIFTING INJECTION ? ?  ? ?Patient location during evaluation: PACU ?Anesthesia Type: MAC ?Level of consciousness: awake and alert ?Pain management: pain level controlled ?Vital Signs Assessment: post-procedure vital signs reviewed and stable ?Respiratory status: spontaneous breathing, nonlabored ventilation, respiratory function stable and patient connected to nasal cannula oxygen ?Cardiovascular status: stable and blood pressure returned to baseline ?Postop Assessment: no apparent nausea or vomiting ?Anesthetic complications: no ? ? ?No notable events documented. ? ?Last Vitals:  ?Vitals:  ? 11/03/21 1102 11/03/21 1112  ?BP: 122/77 124/90  ?Pulse: 63 70  ?Resp: 16 16  ?Temp:    ?SpO2: 98% 97%  ?  ?Last Pain:  ?Vitals:  ? 11/03/21 1112  ?TempSrc:   ?PainSc: 0-No pain  ? ? ?  ?  ?  ?  ?  ?  ? ?Henreitta Spittler S ? ? ? ? ?

## 2021-11-03 NOTE — Discharge Instructions (Signed)
YOU HAD AN ENDOSCOPIC PROCEDURE TODAY: Refer to the procedure report and other information in the discharge instructions given to you for any specific questions about what was found during the examination. If this information does not answer your questions, please call [facility] office at [phone] to clarify.  ° °YOU SHOULD EXPECT: Some feelings of bloating in the abdomen. Passage of more gas than usual. Walking can help get rid of the air that was put into your GI tract during the procedure and reduce the bloating. If you had a lower endoscopy (such as a colonoscopy or flexible sigmoidoscopy) you may notice spotting of blood in your stool or on the toilet paper. Some abdominal soreness may be present for a day or two, also. ° °DIET: Your first meal following the procedure should be a light meal and then it is ok to progress to your normal diet. A half-sandwich or bowl of soup is an example of a good first meal. Heavy or fried foods are harder to digest and may make you feel nauseous or bloated. Drink plenty of fluids but you should avoid alcoholic beverages for 24 hours. If you had a esophageal dilation, please see attached instructions for diet.   ° °ACTIVITY: Your care partner should take you home directly after the procedure. You should plan to take it easy, moving slowly for the rest of the day. You can resume normal activity the day after the procedure however YOU SHOULD NOT DRIVE, use power tools, machinery or perform tasks that involve climbing or major physical exertion for 24 hours (because of the sedation medicines used during the test).  ° °SYMPTOMS TO REPORT IMMEDIATELY: °A gastroenterologist can be reached at any hour. Please call [phone]  for any of the following symptoms:  °Following lower endoscopy (colonoscopy, flexible sigmoidoscopy) °Excessive amounts of blood in the stool  °Significant tenderness, worsening of abdominal pains  °Swelling of the abdomen that is new, acute  °Fever of 100° or  higher  °Following upper endoscopy (EGD, EUS, ERCP, esophageal dilation) °Vomiting of blood or coffee ground material  °New, significant abdominal pain  °New, significant chest pain or pain under the shoulder blades  °Painful or persistently difficult swallowing  °New shortness of breath  °Black, tarry-looking or red, bloody stools ° °FOLLOW UP:  °If any biopsies were taken you will be contacted by phone or by letter within the next 1-3 weeks. Call [phone]  if you have not heard about the biopsies in 3 weeks.  °Please also call with any specific questions about appointments or follow up tests. ° °

## 2021-11-03 NOTE — Anesthesia Preprocedure Evaluation (Signed)
Anesthesia Evaluation  ?Patient identified by MRN, date of birth, ID band ?Patient awake ? ? ? ?Reviewed: ?Allergy & Precautions, NPO status , Patient's Chart, lab work & pertinent test results ? ?Airway ?Mallampati: II ? ?TM Distance: >3 FB ?Neck ROM: Full ? ? ? Dental ?no notable dental hx. ? ?  ?Pulmonary ?asthma ,  ?  ?Pulmonary exam normal ?breath sounds clear to auscultation ? ? ? ? ? ? Cardiovascular ?negative cardio ROS ?Normal cardiovascular exam ?Rhythm:Regular Rate:Normal ? ? ?  ?Neuro/Psych ?negative neurological ROS ? negative psych ROS  ? GI/Hepatic ?negative GI ROS, Neg liver ROS,   ?Endo/Other  ?diabetes ? Renal/GU ?negative Renal ROS  ?negative genitourinary ?  ?Musculoskeletal ?negative musculoskeletal ROS ?(+)  ? Abdominal ?  ?Peds ?negative pediatric ROS ?(+)  Hematology ?negative hematology ROS ?(+)   ?Anesthesia Other Findings ? ? Reproductive/Obstetrics ?negative OB ROS ? ?  ? ? ? ? ? ? ? ? ? ? ? ? ? ?  ?  ? ? ? ? ? ? ? ? ?Anesthesia Physical ?Anesthesia Plan ? ?ASA: 2 ? ?Anesthesia Plan: MAC  ? ?Post-op Pain Management: Minimal or no pain anticipated  ? ?Induction: Intravenous ? ?PONV Risk Score and Plan: 1 and Propofol infusion and Treatment may vary due to age or medical condition ? ?Airway Management Planned: Simple Face Mask ? ?Additional Equipment:  ? ?Intra-op Plan:  ? ?Post-operative Plan:  ? ?Informed Consent: I have reviewed the patients History and Physical, chart, labs and discussed the procedure including the risks, benefits and alternatives for the proposed anesthesia with the patient or authorized representative who has indicated his/her understanding and acceptance.  ? ? ? ?Dental advisory given ? ?Plan Discussed with: CRNA and Surgeon ? ?Anesthesia Plan Comments:   ? ? ? ? ? ? ?Anesthesia Quick Evaluation ? ?

## 2021-11-03 NOTE — H&P (Signed)
[facility] History and Physical ? ? ?Primary Care Physician:  Elby Showers, MD ? ? ?Reason for Procedure:   History of colon polyps - polypectomy / EMR of large sigmoid colon polyp ? ?Plan:    Flex sig with polypectomy / EMR ? ? ? ? ?HPI: Jerry Arroyo is a 56 y.o. male  here for colonoscopy to remove sigmoid polyp. Initial exam done 09/28/21 - multiple polyps removed. He also had a large sigmoid semipedunculated lesion which appeared higher risk for bleeding and left in place, to remove at the hospital. Patient denies any bowel symptoms at this time. No family history of colon cancer known. Otherwise feels well without any cardiopulmonary symptoms.  ? ?I have discussed risks / benefits of the exam and anesthesia with him and he wants to proceed. Risks include bleeding and perforation with removal of large polyp, he understands and wishes to proceed. ? ? ?Past Medical History:  ?Diagnosis Date  ? Allergy 09/20/2021  ? seasonal and environmental and Bee venom  ? Anxiety   ? Asthma   ? Diabetes mellitus without complication ([place])   ? History of kidney stones   ? Hyperlipidemia   ? ? ?Past Surgical History:  ?Procedure Laterality Date  ? ADENOIDECTOMY    ? HERNIA REPAIR    ? left and right  ? LAMINECTOMY AND MICRODISCECTOMY CERVICAL SPINE  2018  ? C6-7  ? TONSILLECTOMY    ? TYMPANOSTOMY TUBE PLACEMENT    ? ? ?Prior to Admission medications   ?Medication Sig Start Date End Date Taking? Authorizing Provider  ?albuterol (VENTOLIN HFA) 108 (90 Base) MCG/ACT inhaler INHALE 2 PUFFS INTO THE LUNGS EVERY 4 HOURS AS NEEDED FOR WHEEZING OR SHORTNESS OF BREATH 07/12/20  Yes Garnet Sierras, DO  ?ALPRAZolam (XANAX) 0.5 MG tablet Take 1 tablet (0.5 mg total) by mouth 2 (two) times daily as needed for anxiety. 04/19/21  Yes Baxley, Cresenciano Lick, MD  ?atorvastatin (LIPITOR) 80 MG tablet TAKE 1 TABLET BY MOUTH  DAILY 05/20/21  Yes Baxley, Cresenciano Lick, MD  ?Cyanocobalamin (VITAMIN B 12 PO) Take 1,000 mcg by mouth daily.   Yes [provider]  ?dicyclomine (BENTYL) 20 MG tablet Take 1 tablet (20 mg total) by mouth 3 (three) times daily before meals. ?Patient taking differently: Take 20 mg by mouth daily as needed (IBS). 07/18/21  Yes Baxley, Cresenciano Lick, MD  ?empagliflozin (JARDIANCE) 10 MG TABS tablet Take 1 tablet (10 mg total) by mouth daily before breakfast. 10/05/21  Yes Philemon Kingdom, MD  ?EPINEPHrine (EPIPEN 2-PAK) 0.3 mg/0.3 mL IJ SOAJ injection Inject 0.3 mLs (0.3 mg total) into the muscle as needed for anaphylaxis. 03/04/20  Yes Nena Polio, MD  ?escitalopram (LEXAPRO) 20 MG tablet Take one half tab daily x 3 days then increase to one tab daily ?Patient taking differently: Take 20 mg by mouth daily. 10/28/21  Yes Baxley, Cresenciano Lick, MD  ?fexofenadine (ALLEGRA) 180 MG tablet Take 180 mg by mouth daily.   Yes [provider]  ?gabapentin (NEURONTIN) 300 MG capsule TAKE 1 CAPSULE(300 MG) BY MOUTH AT BEDTIME ?Patient taking differently: Take 300 mg by mouth at bedtime as needed (Nerve pain). 04/20/21  Yes Baxley, Cresenciano Lick, MD  ?meloxicam (MOBIC) 15 MG tablet Take 15 mg by mouth daily as needed for pain. 09/08/21  Yes [provider]  ?metFORMIN (GLUCOPHAGE) 1000 MG tablet TAKE 1 TABLET BY MOUTH  TWICE DAILY WITH MEALS 02/18/21  Yes Baxley, Cresenciano Lick, MD  ?  Multiple Vitamin (MULTIVITAMIN) tablet Take 1 tablet by mouth daily.   Yes [provider]  ?potassium citrate (UROCIT-K) 10 MEQ (1080 MG) SR tablet Take 10 mEq by mouth daily.   Yes [provider]  ?Accu-Chek Softclix Lancets lancets Use as instructed 1x a day 10/03/21   Philemon Kingdom, MD  ?glucose blood (ACCU-CHEK GUIDE) test strip Use as instructed 1x a day 10/03/21   Philemon Kingdom, MD  ? ? ?Current Facility-Administered Medications  ?Medication Dose Route Frequency Provider Last Rate Last Admin  ? 0.9 %  sodium chloride infusion   Intravenous Continuous Shandy Checo, Carlota Raspberry, MD      ? lactated ringers infusion   Intravenous Continuous Cerinity Zynda,  Carlota Raspberry, MD      ? ? ?Allergies as of 10/04/2021 - Review Complete 10/03/2021  ?Allergen Reaction Noted  ? Bee venom Swelling 09/12/2011  ? ? ?Family History  ?Problem Relation Age of Onset  ? Healthy Mother   ? Allergic rhinitis Mother   ? Colon polyps Father   ? Healthy Father   ? Colon polyps Paternal Grandmother   ? Colon cancer Paternal Grandmother   ? Esophageal cancer Neg Hx   ? Stomach cancer Neg Hx   ? Rectal cancer Neg Hx   ? ? ?Social History  ? ?Socioeconomic History  ? Marital status: Married  ?  Spouse name: Not on file  ? Number of children: Not on file  ? Years of education: Not on file  ? Highest education level: Not on file  ?Occupational History  ? Not on file  ?Tobacco Use  ? Smoking status: Never  ? Smokeless tobacco: Current  ?  Types: Snuff  ?Vaping Use  ? Vaping Use: Never used  ?Substance and Sexual Activity  ? Alcohol use: Never  ? Drug use: Never  ? Sexual activity: Not on file  ?Other Topics Concern  ? Not on file  ?Social History Narrative  ? Not on file  ? ?Social Determinants of Health  ? ?Financial Resource Strain: Not on file  ?Food Insecurity: Not on file  ?Transportation Needs: Not on file  ?Physical Activity: Not on file  ?Stress: Not on file  ?Social Connections: Not on file  ?Intimate Partner Violence: Not on file  ? ? ?Review of Systems: ?All other review of systems negative except as mentioned in the HPI. ? ?Physical Exam: ?Vital signs ?There were no vitals taken for this visit. ? ?General:   Alert,  Well-developed, pleasant and cooperative in NAD ?Lungs:  Clear throughout to auscultation.   ?Heart:  Regular rate and rhythm ?Abdomen:  Soft, nontender and nondistended.   ?Neuro/Psych:  Alert and cooperative. Normal mood and affect. A and O x 3 ? ?Jolly Mango, MD ?[facility] ? ? ?

## 2021-11-04 LAB — SURGICAL PATHOLOGY

## 2021-11-06 ENCOUNTER — Encounter (HOSPITAL_COMMUNITY): Payer: Self-pay | Admitting: Gastroenterology

## 2021-12-12 ENCOUNTER — Encounter (HOSPITAL_COMMUNITY): Payer: Self-pay | Admitting: Gastroenterology

## 2022-01-09 ENCOUNTER — Ambulatory Visit (INDEPENDENT_AMBULATORY_CARE_PROVIDER_SITE_OTHER): Payer: 59 | Admitting: Internal Medicine

## 2022-01-09 ENCOUNTER — Encounter: Payer: Self-pay | Admitting: Internal Medicine

## 2022-01-09 VITALS — BP 150/92 | HR 75 | Ht 75.0 in | Wt 238.2 lb

## 2022-01-09 DIAGNOSIS — E785 Hyperlipidemia, unspecified: Secondary | ICD-10-CM

## 2022-01-09 DIAGNOSIS — E1165 Type 2 diabetes mellitus with hyperglycemia: Secondary | ICD-10-CM

## 2022-01-09 DIAGNOSIS — E1142 Type 2 diabetes mellitus with diabetic polyneuropathy: Secondary | ICD-10-CM

## 2022-01-09 LAB — POCT GLYCOSYLATED HEMOGLOBIN (HGB A1C): Hemoglobin A1C: 7.2 % — AB (ref 4.0–5.6)

## 2022-01-09 MED ORDER — FLUCONAZOLE 150 MG PO TABS: 150.0000 mg | ORAL_TABLET | Freq: Once | ORAL | 99 refills | Status: AC

## 2022-01-09 MED ORDER — RYBELSUS 7 MG PO TABS: 7.0000 mg | ORAL_TABLET | Freq: Every day | ORAL | 5 refills | Status: DC

## 2022-01-09 NOTE — Patient Instructions (Addendum)
Please continue: ?- Metformin 1000 2x a day with meals. ?- Jardiance 10 mg before b'fast ? ?Try to take Diflucan 150 mg x1 and may repeat in 5 days. If the infection persists, stop Jardiance. ? ?Please add: ?- Rybelsus 3.5 mg daily before b'fast x 1 week, then increase to 7 mg daily. ? ?Please return in 3-4 months with your sugar log. ?

## 2022-01-09 NOTE — Addendum Note (Signed)
Addended by: Lauralyn Primes on: 01/09/2022 02:34 PM ? ? Modules accepted: Orders ? ?
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
## 2022-01-23 ENCOUNTER — Other Ambulatory Visit: Payer: Self-pay | Admitting: Internal Medicine

## 2022-04-08 LAB — HM DIABETES EYE EXAM

## 2022-04-18 ENCOUNTER — Other Ambulatory Visit: Payer: Self-pay | Admitting: Internal Medicine

## 2022-05-02 ENCOUNTER — Encounter: Payer: Self-pay | Admitting: Internal Medicine

## 2022-05-02 ENCOUNTER — Ambulatory Visit (INDEPENDENT_AMBULATORY_CARE_PROVIDER_SITE_OTHER): Payer: 59 | Admitting: Internal Medicine

## 2022-05-02 VITALS — BP 130/84 | HR 89 | Ht 74.0 in | Wt 236.6 lb

## 2022-05-02 DIAGNOSIS — E785 Hyperlipidemia, unspecified: Secondary | ICD-10-CM | POA: Diagnosis not present

## 2022-05-02 DIAGNOSIS — E1165 Type 2 diabetes mellitus with hyperglycemia: Secondary | ICD-10-CM

## 2022-05-02 DIAGNOSIS — E1142 Type 2 diabetes mellitus with diabetic polyneuropathy: Secondary | ICD-10-CM

## 2022-05-02 LAB — POCT GLYCOSYLATED HEMOGLOBIN (HGB A1C): Hemoglobin A1C: 7 % — AB (ref 4.0–5.6)

## 2022-05-02 NOTE — Patient Instructions (Addendum)
Please continue: - Metformin 1000 2x a day with meals. - Jardiance 10 mg before b'fast - Rybelsus 7 mg before b'fast (if sugars are above target after you check some values in am and at bedtime), you can increase to 14 mg daily)  Please return in 3-4 months with your sugar log.
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
## 2022-05-03 ENCOUNTER — Ambulatory Visit: Payer: 59 | Admitting: Internal Medicine

## 2022-05-03 LAB — LIPID PANEL
Cholesterol: 168 mg/dL (ref 0–200)
HDL: 41.8 mg/dL (ref >39.00)
NonHDL: 126
Total CHOL/HDL Ratio: 4
Triglycerides: 306 mg/dL — ABNORMAL HIGH (ref 0.0–149.0)
VLDL: 61.2 mg/dL — ABNORMAL HIGH (ref 0.0–40.0)

## 2022-05-03 LAB — COMPREHENSIVE METABOLIC PANEL
ALT: 27 U/L (ref 0–53)
AST: 20 U/L (ref 0–37)
Albumin: 4.5 g/dL (ref 3.5–5.2)
Alkaline Phosphatase: 64 U/L (ref 39–117)
BUN: 13 mg/dL (ref 6–23)
CO2: 26 mEq/L (ref 19–32)
Calcium: 9.4 mg/dL (ref 8.4–10.5)
Chloride: 104 mEq/L (ref 96–112)
Creatinine, Ser: 0.82 mg/dL (ref 0.40–1.50)
GFR: 97.16 mL/min (ref >60.00)
Glucose, Bld: 175 mg/dL — ABNORMAL HIGH (ref 70–99)
Potassium: 3.9 mEq/L (ref 3.5–5.1)
Sodium: 139 mEq/L (ref 135–145)
Total Bilirubin: 0.4 mg/dL (ref 0.2–1.2)
Total Protein: 7.7 g/dL (ref 6.0–8.3)

## 2022-05-03 LAB — MICROALBUMIN / CREATININE URINE RATIO
Creatinine,U: 68.5 mg/dL
Microalb Creat Ratio: 1.5 mg/g (ref 0.0–30.0)
Microalb, Ur: 1 mg/dL (ref 0.0–1.9)

## 2022-05-03 LAB — LDL CHOLESTEROL, DIRECT: Direct LDL: 85 mg/dL

## 2022-05-04 ENCOUNTER — Encounter: Payer: Self-pay | Admitting: Internal Medicine

## 2022-05-05 ENCOUNTER — Other Ambulatory Visit: Payer: Self-pay | Admitting: Internal Medicine

## 2022-05-05 MED ORDER — EZETIMIBE 10 MG PO TABS: 10.0000 mg | ORAL_TABLET | Freq: Every day | ORAL | 3 refills | Status: DC

## 2022-08-03 ENCOUNTER — Other Ambulatory Visit: Payer: Self-pay | Admitting: Internal Medicine

## 2022-08-03 ENCOUNTER — Encounter: Payer: Self-pay | Admitting: Internal Medicine

## 2022-09-07 ENCOUNTER — Ambulatory Visit (INDEPENDENT_AMBULATORY_CARE_PROVIDER_SITE_OTHER): Payer: 59 | Admitting: Internal Medicine

## 2022-09-07 ENCOUNTER — Encounter: Payer: Self-pay | Admitting: Internal Medicine

## 2022-09-07 VITALS — BP 110/78 | HR 85 | Ht 74.0 in | Wt 238.2 lb

## 2022-09-07 DIAGNOSIS — E1142 Type 2 diabetes mellitus with diabetic polyneuropathy: Secondary | ICD-10-CM | POA: Diagnosis not present

## 2022-09-07 DIAGNOSIS — E785 Hyperlipidemia, unspecified: Secondary | ICD-10-CM

## 2022-09-07 DIAGNOSIS — E1165 Type 2 diabetes mellitus with hyperglycemia: Secondary | ICD-10-CM | POA: Diagnosis not present

## 2022-09-07 LAB — POCT GLYCOSYLATED HEMOGLOBIN (HGB A1C): Hemoglobin A1C: 6.8 % — AB (ref 4.0–5.6)
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
## 2022-09-07 NOTE — Patient Instructions (Addendum)
Please continue: - Metformin 1000 2x a day with meals. - Jardiance 10 mg before b'fast - Rybelsus 7 mg before b'fast  Please return in 4 months with your sugar log.

## 2022-09-27 ENCOUNTER — Other Ambulatory Visit: Payer: Self-pay | Admitting: Internal Medicine

## 2022-09-28 ENCOUNTER — Telehealth: Payer: Self-pay | Admitting: Internal Medicine

## 2022-09-28 ENCOUNTER — Encounter: Payer: Self-pay | Admitting: Internal Medicine

## 2022-09-28 ENCOUNTER — Ambulatory Visit (INDEPENDENT_AMBULATORY_CARE_PROVIDER_SITE_OTHER): Payer: 59 | Admitting: Internal Medicine

## 2022-09-28 VITALS — BP 110/78 | HR 76 | Temp 98.1°F

## 2022-09-28 DIAGNOSIS — R0789 Other chest pain: Secondary | ICD-10-CM

## 2022-09-28 DIAGNOSIS — E785 Hyperlipidemia, unspecified: Secondary | ICD-10-CM

## 2022-09-28 DIAGNOSIS — E1169 Type 2 diabetes mellitus with other specified complication: Secondary | ICD-10-CM | POA: Diagnosis not present

## 2022-09-28 DIAGNOSIS — J069 Acute upper respiratory infection, unspecified: Secondary | ICD-10-CM

## 2022-09-28 DIAGNOSIS — Z8659 Personal history of other mental and behavioral disorders: Secondary | ICD-10-CM

## 2022-09-28 DIAGNOSIS — E119 Type 2 diabetes mellitus without complications: Secondary | ICD-10-CM | POA: Diagnosis not present

## 2022-09-28 MED ORDER — ALBUTEROL SULFATE HFA 108 (90 BASE) MCG/ACT IN AERS: 2.0000 | INHALATION_SPRAY | Freq: Four times a day (QID) | RESPIRATORY_TRACT | 2 refills | Status: DC | PRN

## 2022-09-28 MED ORDER — ALBUTEROL SULFATE HFA 108 (90 BASE) MCG/ACT IN AERS
2.0000 | INHALATION_SPRAY | Freq: Four times a day (QID) | RESPIRATORY_TRACT | 2 refills | Status: DC | PRN
Start: 2022-09-28 — End: 2023-03-08

## 2022-09-28 MED ORDER — AZITHROMYCIN 250 MG PO TABS: ORAL_TABLET | ORAL | 0 refills | Status: AC

## 2022-09-28 NOTE — Progress Notes (Signed)
Subjective:    Patient ID: Jerry Arroyo , male    DOB: 1965/10/23, 57 y.o.    MRN: [id number]   56 y.o. Male presents today for left ear ache.  He is complaining of a left ear ache and sinus congestion that started about 3 weeks ago. He reports that it feels like there is fluid in his left ear. It started with sinus pressure in his head that moved into his chest. He is producing green mucus when blowing his nose. He denies sick contacts.  He reports having difficulty catching his breath intermittently. This started spontaneously 3 months ago and usually occurs twice per week. He is requesting an Albuterol refill. He has stopped seeing his allergist due to increasing costs and resolution of problems. He denies any recent increases in stress. He has not reported this symptom to his endocrinologist. He is requesting a refill of his ProAir.  He has a history of anxiety. He is taking Zoloft 200 mg, Lamotrigine 150, and Xanax as needed.  He has been compliant with Jardiance, Rybelsus, and Metformin. His hemoglobin A1C has dropped to 6.8 from 7.1 on 07/14/21. He is following up with her endocrinologist, Dr. Cruzita Lederer on 01/11/2023.   Family History  Problem Relation Age of Onset   Healthy Mother    Allergic rhinitis Mother    Colon polyps Father    Healthy Father    Colon polyps Paternal Grandmother    Colon cancer Paternal Grandmother    Esophageal cancer Neg Hx    Stomach cancer Neg Hx    Rectal cancer Neg Hx     Past Medical History:  Diagnosis Date   Allergy 09/20/2021   seasonal and environmental and Bee venom   Anxiety    Asthma    Diabetes mellitus without complication ([place])    History of kidney stones    Hyperlipidemia      Social History   Social History Narrative   Not on file    Patient Care Team: Laney Bagshaw, Cresenciano Lick, MD as PCP - General (Internal Medicine)   Review of Systems  Constitutional:  Negative for chills, fever and malaise/fatigue.  HENT:  Positive for  congestion, ear pain (Left) and sinus pain.        (+) Green nasal discharge when blowing nose  Eyes:  Negative for blurred vision.  Respiratory:  Negative for cough and shortness of breath.   Cardiovascular:  Negative for chest pain, palpitations and leg swelling.  Gastrointestinal:  Negative for diarrhea, nausea and vomiting.  Musculoskeletal:  Negative for back pain.  Skin:  Negative for rash.  Neurological:  Negative for loss of consciousness and headaches.  All other systems reviewed and are negative.       Objective:   Vitals: BP 110/78   Pulse 76   Temp 98.1 F (36.7 C) (Tympanic)   SpO2 98%    Physical Exam Vitals and nursing note reviewed.  Constitutional:      General: He is awake. He is not in acute distress.    Appearance: Normal appearance. He is not ill-appearing or toxic-appearing.  HENT:     Head: Atraumatic.     Right Ear: Ear canal and external ear normal.     Left Ear: Ear canal and external ear normal.     Ears:     Comments: Left ear is injected centrally. TM dull in right ear.    Mouth/Throat:     Mouth: Mucous membranes are moist.  Musculoskeletal:  Cervical back: Normal range of motion.  Skin:    General: Skin is warm.  Neurological:     General: No focal deficit present.     Mental Status: He is alert and oriented to person, place, and time. Mental status is at baseline.  Psychiatric:        Mood and Affect: Mood normal.        Behavior: Behavior normal. Behavior is cooperative.        Thought Content: Thought content normal.        Judgment: Judgment normal.          Assessment & Plan:    URI: Will start Azithromycin. Refilled Albuterol inhaler.   I,Alexander Ruley,acting as a Education administrator for Elby Showers, MD.,have documented all relevant documentation on the behalf of Elby Showers, MD,as directed by  Elby Showers, MD while in the presence of Elby Showers, MD.   I, Elby Showers, MD, have reviewed all documentation for this  visit. The documentation on 09/28/22 for the exam, diagnosis, procedures, and orders are all accurate and complete.

## 2022-09-28 NOTE — Patient Instructions (Signed)
Referral to Cardiology

## 2022-09-28 NOTE — Progress Notes (Signed)
Subjective:    Patient ID: Jerry Arroyo, male    DOB: 12/01/1965, 57 y.o.   MRN: [id number]  HPI 57 year old Male seen for left ear pain.Denies sore throat cough or congestion.Had flu vaccine December 2023. No recent Covid vaccine. 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  PMH: Left inguinal hernia repair 1983.  Right inguinal hernia repair 1998.  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  History of Mnire's disease and is seeing Dr. Idelle Crouch in the past at [facility] treated with HCTZ.Marland Kitchen  History of left lithotripsy in 2007 for kidney stones.  History of calcium oxalate kidney stones.  History of cervical laminectomy and microdiscectomy C6-C7.  Remote history of tonsillectomy.  Tympanostomy tube placement in the remote past.  Has seen allergist and Dr. Scherrie Bateman has him on Ventolin inhaler as needed  Has had paresthesias/numbness in his feet treated with Neurontin in 2021.  History of herpes zoster in May 2019.  Has had left foot plantar fasciitis in the remote past.  In October 5638 he saw Dr. Erik Obey for Mnire's disease and was having issues with tinnitus.  Tympanograms were normal on each side.  Dr. Erik Obey did not think he had Mnire's disease at that time.  Pure-tone audiometry was normal in each ear.  Social history: He works full-time for [employer] as an Therapist, sports.  He is married.  Wife is a retired Automotive engineer.  2 daughters, 1 adult daughter who is married.  Another daughter lives at home and attends [employer].  He does not smoke or consume alcohol.  Family history: Father with history of diabetes and colon polyps.  Colon cancer in paternal grandmother.   History of peptic ulcer disease at age 12.  History of kidney stones (calcium oxalate (2004 in 2007.  Intolerant of loaded-it causes nausea and vomiting.  Sees Dr. Letta Median for diabetic management, Is on Jardiance. Hx hyperlipidemia treated with Zetia. Recent  Hgb AIC was 6.8%  Had large colon polyp removed March 2023 by Dr. Havery Moros which was a villous adenoma.  Review of Systems Patient complains of chest pressure/tightness  occurring 2 days a week for 3 months about 2 times a week.EKG today is WNL.  Last EKG January 2008 was normal as well. With hx of Diabetes,may need Cardiology evaluation.  Left ear discomfort.     Objective:   Physical Exam Vital signs reviewed.  He is afebrile.  Blood pressure 110/78 pulse 76 regular pulse oximetry 98% on room air.  Respiratory rate is normal.  He is in no acute distress.  Skin: Warm and dry.  No cervical adenopathy.  TMs are not red.  Pharynx is without exudate and is slightly red.  Neck supple.  Chest clear.  Cardiac exam: Regular rate and rhythm without murmur or ectopy.  No lower extremity pitting edema.       Assessment & Plan:  Acute upper respiratory infection with left otitis media.  Onset was a couple of weeks ago and symptoms continue today.  He was tested for flu and COVID at [facility] recently and both tests were negative.  He was treated today with Zithromax Z-PAK with directions 2 tabs day 1 followed by 1 tab days 2 through 5.  Chest tightness-known coronary artery disease history.  EKG today is within normal limits.  Compared today's EKG with previous EKG from a number of years ago and there is no change.  Referred for  Cardiology evaluation given hx of  Diabetes

## 2022-09-28 NOTE — Telephone Encounter (Signed)
Jerry Arroyo [id number]  [name] called to say his left ear hurts, lingering cough and head congestion, he was sick couple of weeks ago and this is all lingering. He did go to [facility] and was tested for Flu and COVID both were negative. I scheduled him for 2:00 today.

## 2022-10-08 DIAGNOSIS — R079 Chest pain, unspecified: Secondary | ICD-10-CM | POA: Insufficient documentation

## 2022-10-08 NOTE — H&P (View-Only) (Signed)
Cardiology Office Note  Date:  10/09/2022   ID:  Jerry Arroyo, Jerry Arroyo 08/04/1966, MRN [id number]  PCP:  Elby Showers, MD   Chief Complaint  Patient presents with   New Patient (Initial Visit)    Ref by Dr. Renold Genta for chest pain. Patient c/o chest pain that comes and goes with symptoms that will radiate into his neck and jaw with feeling clammy and has shortness of breath at least 2-3 times weekly; symptoms for the past 3 months. Medications reviewed by the patient verbally.     HPI:  Mr. Jerry Arroyo is a 57 year old gentleman with past medical history of Diabetes Hyperlipidemia Mnire's disease Neuropathy Who presents by referral from Dr. Tedra Senegal for chest pain/angina  Seen by primary care September 28, 2022, reported having episodes of chest pain  No prior cardiac workup available  Chest pain starting 4 months ago, chest pain once a week Episodes have persisted, sometimes more than once a week now Had episode of chest pain concerning for angina while on the computer today,  Symptoms lasted 1 hr today, up into neck, 3/10  For the past 4 months also with worsening shortness of breath  When he has episodes, feel like needs to yawn,   Lab work reviewed LDL 85 total cholesterol 168,  without statin cholesterol is 220s as noted in 2016 A1C 6.8  EKG personally reviewed by myself on todays visit Normal sinus rhythm rate 80 bpm left axis deviation no significant ST-T wave changes   PMH:   has a past medical history of Allergy (09/20/2021), Anxiety, Asthma, Diabetes mellitus without complication ([place]), History of kidney stones, and Hyperlipidemia.  PSH:    Past Surgical History:  Procedure Laterality Date   ADENOIDECTOMY     FLEXIBLE SIGMOIDOSCOPY N/A 11/03/2021   Procedure: FLEXIBLE SIGMOIDOSCOPY;  Surgeon: Yetta Flock, MD;  Location: [facility];  Service: Gastroenterology;  Laterality: N/A;   HEMOSTASIS CLIP PLACEMENT  11/03/2021   Procedure: HEMOSTASIS CLIP  PLACEMENT;  Surgeon: Yetta Flock, MD;  Location: [facility];  Service: Gastroenterology;;   HERNIA REPAIR     left and right   LAMINECTOMY AND MICRODISCECTOMY CERVICAL SPINE  2018   C6-7   POLYPECTOMY  11/03/2021   Procedure: POLYPECTOMY;  Surgeon: Yetta Flock, MD;  Location: [facility];  Service: Gastroenterology;;   SUBMUCOSAL LIFTING INJECTION  11/03/2021   Procedure: SUBMUCOSAL LIFTING INJECTION;  Surgeon: Yetta Flock, MD;  [facility]: [facility];  Service: Gastroenterology;;   TONSILLECTOMY     TYMPANOSTOMY TUBE PLACEMENT      Current Outpatient Medications  Medication Sig Dispense Refill   albuterol (VENTOLIN HFA) 108 (90 Base) MCG/ACT inhaler Inhale 2 puffs into the lungs every 6 (six) hours as needed for wheezing or shortness of breath. 8 g 2   ALPRAZolam (XANAX) 0.5 MG tablet Take 1 tablet (0.5 mg total) by mouth 2 (two) times daily as needed for anxiety. 60 tablet 2   atorvastatin (LIPITOR) 80 MG tablet TAKE 1 TABLET BY MOUTH ONCE DAILY 90 tablet 3   Cyanocobalamin (VITAMIN B 12 PO) Take 1,000 mcg by mouth daily.     dicyclomine (BENTYL) 20 MG tablet Take 1 tablet (20 mg total) by mouth 3 (three) times daily before meals. (Patient taking differently: Take 20 mg by mouth daily as needed (IBS).) 90 tablet 1   ezetimibe (ZETIA) 10 MG tablet Take 1 tablet (10 mg total) by mouth daily. 90 tablet 3   fexofenadine (ALLEGRA) 180 MG tablet  Take 180 mg by mouth daily.     gabapentin (NEURONTIN) 300 MG capsule TAKE 1 CAPSULE(300 MG) BY MOUTH AT BEDTIME (Patient taking differently: Take 100 mg by mouth daily. Patient reports taking 100 mg twice daily) 90 capsule 3   JARDIANCE 10 MG TABS tablet TAKE 1 TABLET(10 MG) BY MOUTH DAILY BEFORE BREAKFAST 90 tablet 3   lamoTRIgine (LAMICTAL) 150 MG tablet Take 150 mg by mouth daily.     meloxicam (MOBIC) 15 MG tablet Take 15 mg by mouth daily as needed for pain.     metFORMIN (GLUCOPHAGE) 1000 MG tablet TAKE 1 TABLET BY MOUTH   TWICE DAILY WITH MEALS 180 tablet 3   Multiple Vitamin (MULTIVITAMIN) tablet Take 1 tablet by mouth daily.     nitroGLYCERIN (NITROSTAT) 0.4 MG SL tablet Place 1 tablet (0.4 mg total) under the tongue every 5 (five) minutes as needed for chest pain. 25 tablet 3   potassium citrate (UROCIT-K) 10 MEQ (1080 MG) SR tablet Take 10 mEq by mouth daily.     Semaglutide (RYBELSUS) 7 MG TABS TAKE 1 TABLET BY MOUTH DAILY 30 tablet 1   sertraline (ZOLOFT) 100 MG tablet Take 200 mg by mouth.     Accu-Chek Softclix Lancets lancets Use as instructed 1x a day (Patient not taking: Reported on 10/09/2022) 100 each 12   EPINEPHrine (EPIPEN 2-PAK) 0.3 mg/0.3 mL IJ SOAJ injection Inject 0.3 mLs (0.3 mg total) into the muscle as needed for anaphylaxis. (Patient not taking: Reported on 10/09/2022) 1 each 3   escitalopram (LEXAPRO) 20 MG tablet Take one half tab daily x 3 days then increase to one tab daily (Patient not taking: Reported on 10/09/2022) 30 tablet 0   glucose blood (ACCU-CHEK GUIDE) test strip Use as instructed 1x a day (Patient not taking: Reported on 10/09/2022) 100 each 12   No current facility-administered medications for this visit.    Allergies:   Bee venom   Social History:  The patient  reports that he has never smoked. His smokeless tobacco use includes snuff. He reports that he does not drink alcohol and does not use drugs.   Family History:   family history includes Allergic rhinitis in his mother; Colon cancer in his paternal grandmother; Colon polyps in his father and paternal grandmother; Healthy in his father and mother; Heart disease (age of onset: 64) in his father; Heart failure in his mother.    Review of Systems: Review of Systems  Constitutional: Negative.   HENT: Negative.    Respiratory:  Positive for shortness of breath.   Cardiovascular:  Positive for chest pain.  Gastrointestinal: Negative.   Musculoskeletal: Negative.   Neurological: Negative.   Psychiatric/Behavioral:  Negative.    All other systems reviewed and are negative.    PHYSICAL EXAM: VS:  BP 118/80 (BP Location: Right Arm, Patient Position: Sitting, Cuff Size: Normal)   Pulse 80   Ht '6\' 3"'$  (1.905 m)   Wt 235 lb 4 oz (106.7 kg)   SpO2 97%   BMI 29.40 kg/m  , BMI Body mass index is 29.4 kg/m. GEN: Well nourished, well developed, in no acute distress HEENT: normal Neck: no JVD, carotid bruits, or masses Cardiac: RRR; no murmurs, rubs, or gallops,no edema  Respiratory:  clear to auscultation bilaterally, normal work of breathing GI: soft, nontender, nondistended, + BS MS: no deformity or atrophy Skin: warm and dry, no rash Neuro:  Strength and sensation are intact Psych: euthymic mood, full affect  Recent Labs: 05/02/2022:  ALT 27; BUN 13; Creatinine, Ser 0.82; Potassium 3.9; Sodium 139    Lipid Panel Lab Results  Component Value Date   CHOL 168 05/02/2022   HDL 41.80 05/02/2022   LDLCALC 89 04/18/2021   TRIG 306.0 (H) 05/02/2022      Wt Readings from Last 3 Encounters:  10/09/22 235 lb 4 oz (106.7 kg)  09/07/22 238 lb 3.2 oz (108 kg)  05/02/22 236 lb 9.6 oz (107.3 kg)     ASSESSMENT AND PLAN:  Problem List Items Addressed This Visit       Cardiology Problems   Hyperlipidemia   Relevant Medications   nitroGLYCERIN (NITROSTAT) 0.4 MG SL tablet     Other   Chest pain of uncertain etiology   Relevant Orders   Basic metabolic panel   CBC   Type 2 diabetes mellitus without complications ([place]) - Primary   Other Visit Diagnoses     Unstable angina (HCC)       Relevant Medications   nitroGLYCERIN (NITROSTAT) 0.4 MG SL tablet      Unstable angina Chest pain symptoms over the past 4 months, getting more intense Now radiating up into his neck with diaphoresis, also having symptoms of shortness of breath that come and go Risk factors for coronary disease include hyperlipidemia, diabetes Denies prior smoking history Symptoms concerning for unstable angina Discussed  various treatment options with him including cardiac catheterization versus noninvasive testing such as cardiac CTA On further discussion, he would prefer cardiac cardiac catheterization for definitive evaluation and option to fix a blockage if it is found He is also concerned as symptoms seem to be getting more intense, now with radiation into neck We have provided prescription for nitro Scheduled him for cardiac catheterization February 7 at 11:30 AM I have reviewed the risks, indications, and alternatives to cardiac catheterization, possible angioplasty, and stenting with the patient. Risks include but are not limited to bleeding, infection, vascular injury, stroke, myocardial infection, arrhythmia, kidney injury, radiation-related injury in the case of prolonged fluoroscopy use, emergency cardiac surgery, and death. The patient understands the risks of serious complication is 1-2 in 8333 with diagnostic cardiac cath and 1-2% or less with angioplasty/stenting.   Hyperlipidemia Well-controlled on cholesterol medication, Lipitor 80 daily with Zetia  Diabetes type 2 Will defer to primary care, stressed importance of aggressive diabetes control   Total encounter time more than 60 minutes  Greater than 50% was spent in counseling and coordination of care with the patient    Signed, Esmond Plants, M.D., Ph.D. [place], [place]

## 2022-10-08 NOTE — Progress Notes (Unsigned)
Cardiology Office Note  Date:  10/09/2022   ID:  Jerry Arroyo, Jerry Arroyo May 09, 1966, MRN [id number]  PCP:  Elby Showers, MD   Chief Complaint  Patient presents with   New Patient (Initial Visit)    Ref by Dr. Renold Genta for chest pain. Patient c/o chest pain that comes and goes with symptoms that will radiate into his neck and jaw with feeling clammy and has shortness of breath at least 2-3 times weekly; symptoms for the past 3 months. Medications reviewed by the patient verbally.     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 Tedra Senegal for chest pain/angina  Seen by primary care September 28, 2022, reported having episodes of chest pain  No prior cardiac workup available  Chest pain starting 4 months ago, chest pain once a week Episodes have persisted, sometimes more than once a week now Had episode of chest pain concerning for angina while on the computer today,  Symptoms lasted 1 hr today, up into neck, 3/10  For the past 4 months also with worsening shortness of breath  When he has episodes, feel like needs to yawn,   Lab work reviewed LDL 85 total cholesterol 168,  without statin cholesterol is 220s as noted in 2016 A1C 6.8  EKG personally reviewed by myself on todays visit Normal sinus rhythm rate 80 bpm left axis deviation no significant ST-T wave changes   PMH:   has a past medical history of Allergy (09/20/2021), Anxiety, Asthma, Diabetes mellitus without complication ([place]), History of kidney stones, and Hyperlipidemia.  PSH:    Past Surgical History:  Procedure Laterality Date   ADENOIDECTOMY     FLEXIBLE SIGMOIDOSCOPY N/A 11/03/2021   Procedure: FLEXIBLE SIGMOIDOSCOPY;  Surgeon: Yetta Flock, MD;  [facility];  Service: Gastroenterology;  Laterality: N/A;   HEMOSTASIS CLIP PLACEMENT  11/03/2021   Procedure: HEMOSTASIS CLIP  PLACEMENT;  Surgeon: Yetta Flock, MD;  [facility];  Service: Gastroenterology;;   HERNIA REPAIR     left and right   LAMINECTOMY AND MICRODISCECTOMY CERVICAL SPINE  2018   C6-7   POLYPECTOMY  11/03/2021   Procedure: POLYPECTOMY;  Surgeon: Yetta Flock, MD;  [facility];  Service: Gastroenterology;;   SUBMUCOSAL LIFTING INJECTION  11/03/2021   Procedure: SUBMUCOSAL LIFTING INJECTION;  Surgeon: Yetta Flock, MD;  [facility];  Service: Gastroenterology;;   TONSILLECTOMY     TYMPANOSTOMY TUBE PLACEMENT      Current Outpatient Medications  Medication Sig Dispense Refill   albuterol (VENTOLIN HFA) 108 (90 Base) MCG/ACT inhaler Inhale 2 puffs into the lungs every 6 (six) hours as needed for wheezing or shortness of breath. 8 g 2   ALPRAZolam (XANAX) 0.5 MG tablet Take 1 tablet (0.5 mg total) by mouth 2 (two) times daily as needed for anxiety. 60 tablet 2   atorvastatin (LIPITOR) 80 MG tablet TAKE 1 TABLET BY MOUTH ONCE DAILY 90 tablet 3   Cyanocobalamin (VITAMIN B 12 PO) Take 1,000 mcg by mouth daily.     dicyclomine (BENTYL) 20 MG tablet Take 1 tablet (20 mg total) by mouth 3 (three) times daily before meals. (Patient taking differently: Take 20 mg by mouth daily as needed (IBS).) 90 tablet 1   ezetimibe (ZETIA) 10 MG tablet Take 1 tablet (10 mg total) by mouth daily. 90 tablet 3   fexofenadine (ALLEGRA) 180 MG tablet  Take 180 mg by mouth daily.     gabapentin (NEURONTIN) 300 MG capsule TAKE 1 CAPSULE(300 MG) BY MOUTH AT BEDTIME (Patient taking differently: Take 100 mg by mouth daily. Patient reports taking 100 mg twice daily) 90 capsule 3   JARDIANCE 10 MG TABS tablet TAKE 1 TABLET(10 MG) BY MOUTH DAILY BEFORE BREAKFAST 90 tablet 3   lamoTRIgine (LAMICTAL) 150 MG tablet Take 150 mg by mouth daily.     meloxicam (MOBIC) 15 MG tablet Take 15 mg by mouth daily as needed for pain.     metFORMIN (GLUCOPHAGE) 1000 MG tablet TAKE 1 TABLET BY MOUTH   TWICE DAILY WITH MEALS 180 tablet 3   Multiple Vitamin (MULTIVITAMIN) tablet Take 1 tablet by mouth daily.     nitroGLYCERIN (NITROSTAT) 0.4 MG SL tablet Place 1 tablet (0.4 mg total) under the tongue every 5 (five) minutes as needed for chest pain. 25 tablet 3   potassium citrate (UROCIT-K) 10 MEQ (1080 MG) SR tablet Take 10 mEq by mouth daily.     Semaglutide (RYBELSUS) 7 MG TABS TAKE 1 TABLET BY MOUTH DAILY 30 tablet 1   sertraline (ZOLOFT) 100 MG tablet Take 200 mg by mouth.     Accu-Chek Softclix Lancets lancets Use as instructed 1x a day (Patient not taking: Reported on 10/09/2022) 100 each 12   EPINEPHrine (EPIPEN 2-PAK) 0.3 mg/0.3 mL IJ SOAJ injection Inject 0.3 mLs (0.3 mg total) into the muscle as needed for anaphylaxis. (Patient not taking: Reported on 10/09/2022) 1 each 3   escitalopram (LEXAPRO) 20 MG tablet Take one half tab daily x 3 days then increase to one tab daily (Patient not taking: Reported on 10/09/2022) 30 tablet 0   glucose blood (ACCU-CHEK GUIDE) test strip Use as instructed 1x a day (Patient not taking: Reported on 10/09/2022) 100 each 12   No current facility-administered medications for this visit.    Allergies:   Bee venom   Social History:  The patient  reports that he has never smoked. His smokeless tobacco use includes snuff. He reports that he does not drink alcohol and does not use drugs.   Family History:   family history includes Allergic rhinitis in his mother; Colon cancer in his paternal grandmother; Colon polyps in his father and paternal grandmother; Healthy in his father and mother; Heart disease (age of onset: 68) in his father; Heart failure in his mother.    Review of Systems: Review of Systems  Constitutional: Negative.   HENT: Negative.    Respiratory:  Positive for shortness of breath.   Cardiovascular:  Positive for chest pain.  Gastrointestinal: Negative.   Musculoskeletal: Negative.   Neurological: Negative.   Psychiatric/Behavioral:  Negative.    All other systems reviewed and are negative.    PHYSICAL EXAM: VS:  BP 118/80 (BP Location: Right Arm, Patient Position: Sitting, Cuff Size: Normal)   Pulse 80   Ht '6\' 3"'$  (1.905 m)   Wt 235 lb 4 oz (106.7 kg)   SpO2 97%   BMI 29.40 kg/m  , BMI Body mass index is 29.4 kg/m. GEN: Well nourished, well developed, in no acute distress HEENT: normal Neck: no JVD, carotid bruits, or masses Cardiac: RRR; no murmurs, rubs, or gallops,no edema  Respiratory:  clear to auscultation bilaterally, normal work of breathing GI: soft, nontender, nondistended, + BS MS: no deformity or atrophy Skin: warm and dry, no rash Neuro:  Strength and sensation are intact Psych: euthymic mood, full affect  Recent Labs: 05/02/2022:  ALT 27; BUN 13; Creatinine, Ser 0.82; Potassium 3.9; Sodium 139    Lipid Panel Lab Results  Component Value Date   CHOL 168 05/02/2022   HDL 41.80 05/02/2022   LDLCALC 89 04/18/2021   TRIG 306.0 (H) 05/02/2022      Wt Readings from Last 3 Encounters:  10/09/22 235 lb 4 oz (106.7 kg)  09/07/22 238 lb 3.2 oz (108 kg)  05/02/22 236 lb 9.6 oz (107.3 kg)     ASSESSMENT AND PLAN:  Problem List Items Addressed This Visit       Cardiology Problems   Hyperlipidemia   Relevant Medications   nitroGLYCERIN (NITROSTAT) 0.4 MG SL tablet     Other   Chest pain of uncertain etiology   Relevant Orders   Basic metabolic panel   CBC   Type 2 diabetes mellitus without complications ([place]) - Primary   Other Visit Diagnoses     Unstable angina (HCC)       Relevant Medications   nitroGLYCERIN (NITROSTAT) 0.4 MG SL tablet      Unstable angina Chest pain symptoms over the past 4 months, getting more intense Now radiating up into his neck with diaphoresis, also having symptoms of shortness of breath that come and go Risk factors for coronary disease include hyperlipidemia, diabetes Denies prior smoking history Symptoms concerning for unstable angina Discussed  various treatment options with him including cardiac catheterization versus noninvasive testing such as cardiac CTA On further discussion, he would prefer cardiac cardiac catheterization for definitive evaluation and option to fix a blockage if it is found He is also concerned as symptoms seem to be getting more intense, now with radiation into neck We have provided prescription for nitro Scheduled him for cardiac catheterization February 7 at 11:30 AM I have reviewed the risks, indications, and alternatives to cardiac catheterization, possible angioplasty, and stenting with the patient. Risks include but are not limited to bleeding, infection, vascular injury, stroke, myocardial infection, arrhythmia, kidney injury, radiation-related injury in the case of prolonged fluoroscopy use, emergency cardiac surgery, and death. The patient understands the risks of serious complication is 1-2 in 1191 with diagnostic cardiac cath and 1-2% or less with angioplasty/stenting.   Hyperlipidemia Well-controlled on cholesterol medication, Lipitor 80 daily with Zetia  Diabetes type 2 Will defer to primary care, stressed importance of aggressive diabetes control   Total encounter time more than 60 minutes  Greater than 50% was spent in counseling and coordination of care with the patient    Signed, Esmond Plants, M.D., Ph.D. [place], [place]

## 2022-10-09 ENCOUNTER — Other Ambulatory Visit
Admission: RE | Admit: 2022-10-09 | Discharge: 2022-10-09 | Disposition: A | Discharge status: Home / Self Care | Payer: 59 | Source: Ambulatory Visit | Attending: Cardiovascular Disease | Admitting: Cardiovascular Disease

## 2022-10-09 ENCOUNTER — Ambulatory Visit: Payer: 59 | Attending: Cardiovascular Disease | Admitting: Cardiovascular Disease

## 2022-10-09 ENCOUNTER — Encounter: Payer: Self-pay | Admitting: Cardiovascular Disease

## 2022-10-09 VITALS — BP 118/80 | HR 80 | Ht 75.0 in | Wt 235.2 lb

## 2022-10-09 DIAGNOSIS — E782 Mixed hyperlipidemia: Secondary | ICD-10-CM

## 2022-10-09 DIAGNOSIS — E119 Type 2 diabetes mellitus without complications: Secondary | ICD-10-CM | POA: Diagnosis not present

## 2022-10-09 DIAGNOSIS — I200 Unstable angina: Secondary | ICD-10-CM | POA: Diagnosis not present

## 2022-10-09 DIAGNOSIS — R079 Chest pain, unspecified: Secondary | ICD-10-CM

## 2022-10-09 LAB — BASIC METABOLIC PANEL
Anion gap: 9 (ref 5–15)
BUN: 16 mg/dL (ref 6–20)
CO2: 24 mmol/L (ref 22–32)
Calcium: 9.2 mg/dL (ref 8.9–10.3)
Chloride: 104 mmol/L (ref 98–111)
Creatinine, Ser: 0.71 mg/dL (ref 0.61–1.24)
GFR, Estimated: >60 mL/min (ref >60)
Glucose, Bld: 123 mg/dL — ABNORMAL HIGH (ref 70–99)
Potassium: 4 mmol/L (ref 3.5–5.1)
Sodium: 137 mmol/L (ref 135–145)

## 2022-10-09 LAB — CBC
HCT: 45.6 % (ref 39.0–52.0)
Hemoglobin: 15.4 g/dL (ref 13.0–17.0)
MCH: 29.1 pg (ref 26.0–34.0)
MCHC: 33.8 g/dL (ref 30.0–36.0)
MCV: 86.2 fL (ref 80.0–100.0)
Platelets: 281 10*3/uL (ref 150–400)
RBC: 5.29 MIL/uL (ref 4.22–5.81)
RDW: 13.2 % (ref 11.5–15.5)
WBC: 8.1 10*3/uL (ref 4.0–10.5)
nRBC: 0 % (ref 0.0–0.2)

## 2022-10-09 MED ORDER — NITROGLYCERIN 0.4 MG SL SUBL: 0.4000 mg | SUBLINGUAL_TABLET | SUBLINGUAL | 3 refills | Status: AC | PRN

## 2022-10-09 NOTE — Patient Instructions (Addendum)
Medication Instructions:   Take Nitroglycerin as needed for chest pain: Place one tablet under the tongue as needed every 5 minutes for chest pain. If you have to use three tablets with no relief, please call 911 or go to your closest Emergency Department.   If you need a refill on your cardiac medications before your next appointment, please call your pharmacy.   Lab work: Your provider would like for you to have following labs drawn: BMET and CBC.   Please go to the [facility] entrance and check in at the front desk.  You do not need an appointment.  They are open from 7am-6 pm.    Testing/Procedures: No new testing needed  Follow-Up: At [facility], you and your health needs are our priority.  As part of our continuing mission to provide you with exceptional heart care, we have created designated Provider Care Teams.  These Care Teams include your primary Cardiologist (physician) and Advanced Practice Providers (APPs -  Physician Assistants and Nurse Practitioners) who all work together to provide you with the care you need, when you need it.  You will need a follow up appointment in 1 month  Providers on your designated Care Team:   Murray Hodgkins, NP Christell Faith, PA-C Cadence Kathlen Mody, [place]  COVID-19 Vaccine Information can be found at: ShippingScam.co.uk For questions related to vaccine distribution or appointments, please email [email or web address] or call [phone].     [facility] [place] [address] Dept: [phone] Loc: [phone]   Cardiac/Peripheral Catheterization   You are scheduled for a Cardiac Catheterization on Wednesday, February 7 with Dr. Harrell Gave End.  1. Arrive at the [facility] entrance at 10:30 am, one hour prior to your procedure. Free valet service is available.  After entering the [facility] please check-in at the  registration desk (1st desk on your right) to receive your armband. After receiving your armband someone will escort you to the cardiac cath/special procedures waiting area. The support person will be asked to wait in the waiting room.  It is OK to have someone drop you off and come back when you are ready to be discharged.        Special note: Every effort is made to have your procedure done on time. Please understand that emergencies sometimes delay scheduled procedures.   . 2. Diet: Do not eat solid foods after midnight.  You may have clear liquids until 5 AM the day of the procedure.  3. Labs: You will need to have blood drawn on 10/09/22. You do not need to be fasting.  4. Medication instructions in preparation for your procedure: Hold all diabetic medication the morning of the procedure. Hold the metformin the morning of the procedure and then 48 hours after.   On the morning of your procedure, take Aspirin 81 mg and any morning medicines NOT listed above.  You may use sips of water.  5. Plan to go home the same day, you will only stay overnight if medically necessary. 6. You MUST have a responsible adult to drive you home. 7. An adult MUST be with you the first 24 hours after you arrive home. 8. Bring a current list of your medications, and the last time and date medication taken. 9. Bring ID and current insurance cards. 10.Please wear clothes that are easy to get on and off and wear slip-on shoes.  Thank you for allowing [place] to care for you!   -- [facility]

## 2022-10-10 ENCOUNTER — Other Ambulatory Visit: Payer: Self-pay | Admitting: Internal Medicine

## 2022-10-11 ENCOUNTER — Encounter: Payer: Self-pay | Admitting: Certified Registered Nurse Anesthetist

## 2022-10-11 ENCOUNTER — Encounter
Admission: RE | Disposition: A | Discharge status: Home / Self Care | Payer: Self-pay | Source: Home / Self Care | Attending: Internal Medicine

## 2022-10-11 ENCOUNTER — Encounter: Payer: Self-pay | Admitting: Internal Medicine

## 2022-10-11 ENCOUNTER — Ambulatory Visit
Admission: RE | Admit: 2022-10-11 | Discharge: 2022-10-11 | Disposition: A | Discharge status: Home / Self Care | Payer: 59 | Attending: Internal Medicine | Admitting: Internal Medicine

## 2022-10-11 ENCOUNTER — Other Ambulatory Visit: Payer: Self-pay

## 2022-10-11 DIAGNOSIS — Z7984 Long term (current) use of oral hypoglycemic drugs: Secondary | ICD-10-CM | POA: Insufficient documentation

## 2022-10-11 DIAGNOSIS — E785 Hyperlipidemia, unspecified: Secondary | ICD-10-CM | POA: Diagnosis not present

## 2022-10-11 DIAGNOSIS — I25110 Atherosclerotic heart disease of native coronary artery with unstable angina pectoris: Secondary | ICD-10-CM | POA: Diagnosis not present

## 2022-10-11 DIAGNOSIS — I200 Unstable angina: Secondary | ICD-10-CM

## 2022-10-11 DIAGNOSIS — Z8249 Family history of ischemic heart disease and other diseases of the circulatory system: Secondary | ICD-10-CM | POA: Diagnosis not present

## 2022-10-11 DIAGNOSIS — E1140 Type 2 diabetes mellitus with diabetic neuropathy, unspecified: Secondary | ICD-10-CM | POA: Insufficient documentation

## 2022-10-11 DIAGNOSIS — I2584 Coronary atherosclerosis due to calcified coronary lesion: Secondary | ICD-10-CM | POA: Diagnosis not present

## 2022-10-11 DIAGNOSIS — H8109 Meniere's disease, unspecified ear: Secondary | ICD-10-CM | POA: Diagnosis not present

## 2022-10-11 HISTORY — PX: LEFT HEART CATH AND CORONARY ANGIOGRAPHY: CATH118249

## 2022-10-11 LAB — GLUCOSE, CAPILLARY
Glucose-Capillary: 136 mg/dL — ABNORMAL HIGH (ref 70–99)
Glucose-Capillary: 124 mg/dL — ABNORMAL HIGH (ref 70–99)

## 2022-10-11 SURGERY — LEFT HEART CATH AND CORONARY ANGIOGRAPHY
Anesthesia: Moderate Sedation | Laterality: Left

## 2022-10-11 MED ORDER — SODIUM CHLORIDE 0.9% FLUSH: 3.0000 mL | Freq: Two times a day (BID) | INTRAVENOUS | Status: DC

## 2022-10-11 MED ORDER — MIDAZOLAM HCL 2 MG/2ML IJ SOLN
INTRAMUSCULAR | Status: DC | PRN
  Administered 2022-10-11: 1 mg via INTRAVENOUS

## 2022-10-11 MED ORDER — SODIUM CHLORIDE 0.9 % IV SOLN: INTRAVENOUS | Status: DC

## 2022-10-11 MED ORDER — SODIUM CHLORIDE 0.9 % IV SOLN: 250.0000 mL | INTRAVENOUS | Status: DC | PRN

## 2022-10-11 MED ORDER — MIDAZOLAM HCL 2 MG/2ML IJ SOLN
INTRAMUSCULAR | Status: AC
  Filled 2022-10-11: qty 2

## 2022-10-11 MED ORDER — HEPARIN SODIUM (PORCINE) 1000 UNIT/ML IJ SOLN
INTRAMUSCULAR | Status: DC | PRN
  Administered 2022-10-11: 5000 [IU] via INTRAVENOUS

## 2022-10-11 MED ORDER — HEPARIN (PORCINE) IN NACL 1000-0.9 UT/500ML-% IV SOLN
INTRAVENOUS | Status: AC
  Filled 2022-10-11: qty 1000

## 2022-10-11 MED ORDER — SODIUM CHLORIDE 0.9% FLUSH: 3.0000 mL | INTRAVENOUS | Status: DC | PRN

## 2022-10-11 MED ORDER — ASPIRIN 81 MG PO TBEC: 81.0000 mg | DELAYED_RELEASE_TABLET | Freq: Every day | ORAL | Status: AC

## 2022-10-11 MED ORDER — SODIUM CHLORIDE 0.9 % WEIGHT BASED INFUSION: 1.0000 mL/kg/h | INTRAVENOUS | Status: DC

## 2022-10-11 MED ORDER — HEPARIN (PORCINE) IN NACL 2000-0.9 UNIT/L-% IV SOLN
INTRAVENOUS | Status: DC | PRN
  Administered 2022-10-11: 1000 mL

## 2022-10-11 MED ORDER — METFORMIN HCL 1000 MG PO TABS: 1000.0000 mg | ORAL_TABLET | Freq: Two times a day (BID) | ORAL | 3 refills | Status: DC

## 2022-10-11 MED ORDER — ACETAMINOPHEN 325 MG PO TABS: 650.0000 mg | ORAL_TABLET | ORAL | Status: DC | PRN

## 2022-10-11 MED ORDER — VERAPAMIL HCL 2.5 MG/ML IV SOLN
INTRAVENOUS | Status: AC
  Filled 2022-10-11: qty 2

## 2022-10-11 MED ORDER — HEPARIN SODIUM (PORCINE) 1000 UNIT/ML IJ SOLN
INTRAMUSCULAR | Status: AC
  Filled 2022-10-11: qty 10

## 2022-10-11 MED ORDER — ASPIRIN 81 MG PO CHEW: 81.0000 mg | CHEWABLE_TABLET | ORAL | Status: DC

## 2022-10-11 MED ORDER — METOPROLOL SUCCINATE ER 25 MG PO TB24: 25.0000 mg | ORAL_TABLET | Freq: Every day | ORAL | 11 refills | Status: DC

## 2022-10-11 MED ORDER — FENTANYL CITRATE (PF) 100 MCG/2ML IJ SOLN
INTRAMUSCULAR | Status: DC | PRN
  Administered 2022-10-11: 25 ug via INTRAVENOUS

## 2022-10-11 MED ORDER — HYDRALAZINE HCL 20 MG/ML IJ SOLN: 10.0000 mg | INTRAMUSCULAR | Status: DC | PRN

## 2022-10-11 MED ORDER — FENTANYL CITRATE (PF) 100 MCG/2ML IJ SOLN
INTRAMUSCULAR | Status: AC
  Filled 2022-10-11: qty 2

## 2022-10-11 MED ORDER — IOHEXOL 300 MG/ML  SOLN
INTRAMUSCULAR | Status: DC | PRN
  Administered 2022-10-11: 65 mL

## 2022-10-11 MED ORDER — SODIUM CHLORIDE 0.9 % WEIGHT BASED INFUSION
3.0000 mL/kg/h | INTRAVENOUS | Status: AC
  Administered 2022-10-11: 3 mL/kg/h via INTRAVENOUS

## 2022-10-11 MED ORDER — ONDANSETRON HCL 4 MG/2ML IJ SOLN: 4.0000 mg | Freq: Four times a day (QID) | INTRAMUSCULAR | Status: DC | PRN

## 2022-10-11 MED ORDER — LABETALOL HCL 5 MG/ML IV SOLN: 10.0000 mg | INTRAVENOUS | Status: DC | PRN

## 2022-10-11 MED ORDER — VERAPAMIL HCL 2.5 MG/ML IV SOLN
INTRAVENOUS | Status: DC | PRN
  Administered 2022-10-11 (×2): 2.5 mg via INTRAVENOUS

## 2022-10-11 SURGICAL SUPPLY — 11 items
BAND ZEPHYR COMPRESS 30 LONG (HEMOSTASIS) IMPLANT
CATH 5F 110X4 TIG (CATHETERS) IMPLANT
CATH INFINITI 5FR ANG PIGTAIL (CATHETERS) IMPLANT
DRAPE BRACHIAL (DRAPES) IMPLANT
GLIDESHEATH SLEND SS 6F .021 (SHEATH) IMPLANT
GUIDEWIRE INQWIRE 1.5J.035X260 (WIRE) IMPLANT
INQWIRE 1.5J .035X260CM (WIRE) ×1
PACK CARDIAC CATH (CUSTOM PROCEDURE TRAY) ×1 IMPLANT
PROTECTION STATION PRESSURIZED (MISCELLANEOUS) ×1
SET ATX SIMPLICITY (MISCELLANEOUS) IMPLANT
STATION PROTECTION PRESSURIZED (MISCELLANEOUS) IMPLANT

## 2022-10-11 NOTE — Interval H&P Note (Signed)
History and Physical Interval Note:  10/11/2022 11:58 AM  Jerry Arroyo  has presented today for surgery, with the diagnosis of unstable angina.  The various methods of treatment have been discussed with the patient and family. After consideration of risks, benefits and other options for treatment, the patient has consented to  Procedure(s): LEFT HEART CATH AND CORONARY ANGIOGRAPHY (Left) as a surgical intervention.  The patient's history has been reviewed, patient examined, no change in status, stable for surgery.  I have reviewed the patient's chart and labs.  Questions were answered to the patient's satisfaction.    Cath Lab Visit (complete for each Cath Lab visit)  Clinical Evaluation Leading to the Procedure:   ACS: No.  Non-ACS:    Anginal Classification: CCS IV  Anti-ischemic medical therapy: No Therapy  Non-Invasive Test Results: No non-invasive testing performed  Prior CABG: No previous CABG  Shamir Tuzzolino

## 2022-10-12 ENCOUNTER — Encounter: Payer: Self-pay | Admitting: Internal Medicine

## 2022-10-12 NOTE — Addendum Note (Signed)
Addended by: Michel Santee on: 10/12/2022 01:38 PM   Modules accepted: Orders

## 2022-10-17 ENCOUNTER — Encounter: Payer: Self-pay | Admitting: Gastroenterology

## 2022-11-14 ENCOUNTER — Ambulatory Visit: Payer: 59 | Attending: Medical | Admitting: Medical

## 2022-11-14 ENCOUNTER — Encounter: Payer: Self-pay | Admitting: Medical

## 2022-11-14 VITALS — BP 112/72 | HR 67 | Ht 75.0 in | Wt 233.4 lb

## 2022-11-14 DIAGNOSIS — I2510 Atherosclerotic heart disease of native coronary artery without angina pectoris: Secondary | ICD-10-CM

## 2022-11-14 DIAGNOSIS — E782 Mixed hyperlipidemia: Secondary | ICD-10-CM | POA: Diagnosis not present

## 2022-11-14 DIAGNOSIS — E119 Type 2 diabetes mellitus without complications: Secondary | ICD-10-CM | POA: Diagnosis not present

## 2022-11-14 NOTE — Progress Notes (Signed)
Cardiology Office Note:    Date:  11/14/2022   ID:  Jerry Arroyo, DOB 1966/01/06, MRN [id number]  PCP:  Elby Showers, MD  [name] Cardiologist:  Ida Rogue, MD  [facility] Electrophysiologist:  None   Referring MD: Elby Showers, MD   Chief Complaint: 1 month follow-up  History of Present Illness:    Jerry Arroyo is a 57 y.o. male with a hx of diabetes, hyperlipidemia, Mnire's disease, neuropathy who presents for chest pain follow-up.  Patient was seen in February 2024 as a new patient for chest pain.  Patient was set up for cardiac cath.  Cath showed mild to moderate nonobstructive CAD.  No culprit lesion was identified to explain unstable angina.  Coronary microvascular dysfunction or noncardiac etiology.  LV gram showed normal LVSF of 55 to 65% with upper normal filling pressure.  Metoprolol was added.  Today, the cardiac cath was reviewed in detail.  The patient denies chest pain or shortness of breath. Patient does lawn care in the afternoons. Diet could be better. No lower leg edema, orthopnea or pnd.  Cath site, right radial, stable.  Patient denies dizziness, lightheadedness, palpitations.  Past Medical History:  Diagnosis Date   Allergy 09/20/2021   seasonal and environmental and Bee venom   Anxiety    Asthma    Diabetes mellitus without complication ([place])    History of kidney stones    Hyperlipidemia     Past Surgical History:  Procedure Laterality Date   ADENOIDECTOMY     FLEXIBLE SIGMOIDOSCOPY N/A 11/03/2021   Procedure: FLEXIBLE SIGMOIDOSCOPY;  Surgeon: Yetta Flock, MD;  Location: [facility];  Service: Gastroenterology;  Laterality: N/A;   HEMOSTASIS CLIP PLACEMENT  11/03/2021   Procedure: HEMOSTASIS CLIP PLACEMENT;  Surgeon: Yetta Flock, MD;  Location: [facility];  Service: Gastroenterology;;   HERNIA REPAIR     left and right   LAMINECTOMY AND MICRODISCECTOMY CERVICAL SPINE  2018   C6-7   LEFT HEART CATH AND  CORONARY ANGIOGRAPHY Left 10/11/2022   Procedure: LEFT HEART CATH AND CORONARY ANGIOGRAPHY;  Surgeon: Nelva Bush, MD;  Location: [facility];  Service: Cardiovascular;  Laterality: Left;   POLYPECTOMY  11/03/2021   Procedure: POLYPECTOMY;  Surgeon: Yetta Flock, MD;  [facility]: [facility];  Service: Gastroenterology;;   SUBMUCOSAL LIFTING INJECTION  11/03/2021   Procedure: SUBMUCOSAL LIFTING INJECTION;  Surgeon: Yetta Flock, MD;  [facility]: [facility];  Service: Gastroenterology;;   TONSILLECTOMY     TYMPANOSTOMY TUBE PLACEMENT      Current Medications: Current Meds  Medication Sig   Accu-Chek Softclix Lancets lancets Use as instructed 1x a day   albuterol (VENTOLIN HFA) 108 (90 Base) MCG/ACT inhaler Inhale 2 puffs into the lungs every 6 (six) hours as needed for wheezing or shortness of breath.   ALPRAZolam (XANAX) 0.5 MG tablet Take 1 tablet (0.5 mg total) by mouth 2 (two) times daily as needed for anxiety.   aspirin EC 81 MG tablet Take 1 tablet (81 mg total) by mouth daily. Swallow whole.   atorvastatin (LIPITOR) 80 MG tablet TAKE 1 TABLET BY MOUTH ONCE DAILY   Cyanocobalamin (VITAMIN B 12 PO) Take 1,000 mcg by mouth daily.   dicyclomine (BENTYL) 20 MG tablet Take 1 tablet (20 mg total) by mouth 3 (three) times daily before meals. (Patient taking differently: Take 20 mg by mouth daily as needed (IBS).)   EPINEPHrine (EPIPEN 2-PAK) 0.3 mg/0.3 mL IJ SOAJ injection Inject 0.3  mLs (0.3 mg total) into the muscle as needed for anaphylaxis.   ezetimibe (ZETIA) 10 MG tablet Take 1 tablet (10 mg total) by mouth daily.   fexofenadine (ALLEGRA) 180 MG tablet Take 180 mg by mouth daily.   gabapentin (NEURONTIN) 300 MG capsule TAKE 1 CAPSULE(300 MG) BY MOUTH AT BEDTIME (Patient taking differently: Take 200 mg by mouth at bedtime as needed. Patient reports taking 100 mg twice daily)   glucose blood (ACCU-CHEK GUIDE) test strip Use as instructed 1x a day   JARDIANCE 10 MG  TABS tablet TAKE 1 TABLET(10 MG) BY MOUTH DAILY BEFORE BREAKFAST   lamoTRIgine (LAMICTAL) 150 MG tablet Take 150 mg by mouth daily.   metFORMIN (GLUCOPHAGE) 1000 MG tablet Take 1 tablet (1,000 mg total) by mouth 2 (two) times daily with a meal.   metoprolol succinate (TOPROL XL) 25 MG 24 hr tablet Take 1 tablet (25 mg total) by mouth daily.   Multiple Vitamin (MULTIVITAMIN) tablet Take 1 tablet by mouth daily.   nitroGLYCERIN (NITROSTAT) 0.4 MG SL tablet Place 1 tablet (0.4 mg total) under the tongue every 5 (five) minutes as needed for chest pain.   potassium citrate (UROCIT-K) 10 MEQ (1080 MG) SR tablet Take 10 mEq by mouth daily.   Semaglutide (RYBELSUS) 7 MG TABS TAKE 1 TABLET BY MOUTH DAILY   sertraline (ZOLOFT) 100 MG tablet Take 200 mg by mouth daily.     Allergies:   Bee venom   Social History   Socioeconomic History   Marital status: Married    Spouse name: Not on file   Number of children: Not on file   Years of education: Not on file   Highest education level: Not on file  Occupational History   Not on file  Tobacco Use   Smoking status: Never   Smokeless tobacco: Current    Types: Snuff  Vaping Use   Vaping Use: Never used  Substance and Sexual Activity   Alcohol use: Never   Drug use: Never   Sexual activity: Not on file  Other Topics Concern   Not on file  Social History Narrative   Not on file   Social Determinants of Health   Financial Resource Strain: Not on file  Food Insecurity: Not on file  Transportation Needs: Not on file  Physical Activity: Not on file  Stress: Not on file  Social Connections: Not on file     Family History: The patient's family history includes Allergic rhinitis in his mother; Colon cancer in his paternal grandmother; Colon polyps in his father and paternal grandmother; Healthy in his father and mother; Heart disease (age of onset: 93) in his father; Heart failure in his mother. There is no history of Esophageal cancer, Stomach  cancer, or Rectal cancer.  ROS:   Please see the history of present illness.     All other systems reviewed and are negative.  EKGs/Labs/Other Studies Reviewed:    The following studies were reviewed today:  Cardiac Cath 10/2022 Conclusions: Mild-moderate, non-obstructive coronary artery disease, as detailed below.  No culprit lesion is identified to explain the patient's unstable angina.  Query microvascular dysfunction or non-cardiac etiology. Normal left ventricular systolic function (LVEF [id number]) with upper normal filling pressure (LVEDP 15 mmHg).   Recommendations: Add metoprolol succinate 25 mg daily for antianginal therapy. Medical therapy and risk factor modification to prevent progression of disease.   Nelva Bush, MD [facility]  EKG:  EKG is ordered today.  The ekg ordered today  demonstrates normal sinus rhythm, 67 bpm, nonspecific T wave changes Recent Labs: 05/02/2022: ALT 27 10/09/2022: BUN 16; Creatinine, Ser 0.71; Hemoglobin 15.4; Platelets 281; Potassium 4.0; Sodium 137  Recent Lipid Panel    Component Value Date/Time   CHOL 168 05/02/2022 1558   TRIG 306.0 (H) 05/02/2022 1558   HDL 41.80 05/02/2022 1558   CHOLHDL 4 05/02/2022 1558   VLDL 61.2 (H) 05/02/2022 1558   LDLCALC 89 04/18/2021 1105   LDLDIRECT 85.0 05/02/2022 1558    Physical Exam:    VS:  BP 112/72 (BP Location: Left Arm, Patient Position: Sitting, Cuff Size: Normal)   Pulse 67   Ht '6\' 3"'$  (1.905 m)   Wt 233 lb 6.4 oz (105.9 kg)   SpO2 97%   BMI 29.17 kg/m     Wt Readings from Last 3 Encounters:  11/14/22 233 lb 6.4 oz (105.9 kg)  10/11/22 233 lb (105.7 kg)  10/09/22 235 lb 4 oz (106.7 kg)     GEN:  Well nourished, well developed in no acute distress HEENT: Normal NECK: No JVD; No carotid bruits LYMPHATICS: No lymphadenopathy CARDIAC: RRR, no murmurs, rubs, gallops RESPIRATORY:  Clear to auscultation without rales, wheezing or rhonchi  ABDOMEN: Soft, non-tender,  non-distended MUSCULOSKELETAL:  No edema; No deformity  SKIN: Warm and dry NEUROLOGIC:  Alert and oriented x 3 PSYCHIATRIC:  Normal affect   ASSESSMENT:    1. Coronary artery disease involving native coronary artery of native heart without angina pectoris   2. Hyperlipidemia, mixed   3. Type 2 diabetes mellitus without complication, without long-term current use of insulin (HCC)    PLAN:    In order of problems listed above:  Nonobstructive CAD Recent cardiac cath showed nonobstructive CAD with normal LVEF.  Patient denies any further chest pain or shortness of breath.  Patient is fairly active, doing lawn care in the afternoons.  Diet could be better.  No further ischemic workup at this time.  Continue aspirin 81 mg daily, Lipitor 80 mg daily, Zetia 10 mg daily, Toprol 25 mg daily, sublingual nitro as needed.  Hyperlipidemia LDL 85 2023.  Patient is on Lipitor 80 mg daily and Zetia 10 mg daily.  Lifestyle changes discussed in detail.  Diabetes type 2 Most recent A1c 6.8.  Followed by PCP.  Disposition: Follow up in 1 year(s) with MD   Signed, Brandyn Thien Ninfa Meeker, PA-C  11/14/2022 8:44 AM    [facility]

## 2022-11-14 NOTE — Patient Instructions (Signed)
Medication Instructions:  Your physician recommends that you continue on your current medications as directed. Please refer to the Current Medication list given to you today.  *If you need a refill on your cardiac medications before your next appointment, please call your pharmacy*   Lab Work: No labs ordered  If you have labs (blood work) drawn today and your tests are completely normal, you will receive your results only by: [place] (if you have MyChart) OR A paper copy in the mail If you have any lab test that is abnormal or we need to change your treatment, we will call you to review the results.   Testing/Procedures: No testing ordered  Follow-Up: At [facility], you and your health needs are our priority.  As part of our continuing mission to provide you with exceptional heart care, we have created designated Provider Care Teams.  These Care Teams include your primary Cardiologist (physician) and Advanced Practice Providers (APPs -  Physician Assistants and Nurse Practitioners) who all work together to provide you with the care you need, when you need it.  We recommend signing up for the patient portal called "MyChart".  Sign up information is provided on this After Visit Summary.  MyChart is used to connect with patients for Virtual Visits (Telemedicine).  Patients are able to view lab/test results, encounter notes, upcoming appointments, etc.  Non-urgent messages can be sent to your provider as well.   To learn more about what you can do with MyChart, go to [email or web address].    Your next appointment:   1 year(s)  Provider:   Ida Rogue, MD

## 2022-11-15 ENCOUNTER — Telehealth: Payer: Self-pay | Admitting: Internal Medicine

## 2022-11-15 NOTE — Telephone Encounter (Signed)
Called patient to see how he is doing and to schedule his CPE for this year.

## 2022-12-11 ENCOUNTER — Telehealth: Payer: Self-pay | Admitting: Internal Medicine

## 2022-12-11 NOTE — Telephone Encounter (Signed)
Patient called today to get refill on medication and said he was feeling fine and does not have time to schedule physical, will call back when he has time.

## 2022-12-11 NOTE — Telephone Encounter (Signed)
Jerry Arroyo [phone]  Jerry Arroyo called to say he needs refill on below medication sent to below pharmacy. I let him know I had called him a couple weeks ago to check on him and schedule his physical and he never called  back. He said he was fine and he did not have time to schedule physical right now he was busy, he just needs his medicine. I let him know he needs to get this appointment and labs schedule because he has not had it done since 04/2022 and last physical was 04/2021. He said he would call back when he has time but it would probably not be today, he was in between calls.  atorvastatin (LIPITOR) 80 MG tablet   [id number] - [place], [place] - [address] AT [facility] OF [address] Phone: [phone]  Fax: [phone]

## 2023-01-11 ENCOUNTER — Ambulatory Visit (INDEPENDENT_AMBULATORY_CARE_PROVIDER_SITE_OTHER): Payer: 59 | Admitting: Internal Medicine

## 2023-01-11 ENCOUNTER — Encounter: Payer: Self-pay | Admitting: Internal Medicine

## 2023-01-11 VITALS — BP 130/88 | HR 78 | Ht 75.0 in | Wt 239.0 lb

## 2023-01-11 DIAGNOSIS — Z7984 Long term (current) use of oral hypoglycemic drugs: Secondary | ICD-10-CM

## 2023-01-11 DIAGNOSIS — E1142 Type 2 diabetes mellitus with diabetic polyneuropathy: Secondary | ICD-10-CM | POA: Diagnosis not present

## 2023-01-11 DIAGNOSIS — E785 Hyperlipidemia, unspecified: Secondary | ICD-10-CM | POA: Diagnosis not present

## 2023-01-11 DIAGNOSIS — E119 Type 2 diabetes mellitus without complications: Secondary | ICD-10-CM

## 2023-01-11 LAB — POCT GLYCOSYLATED HEMOGLOBIN (HGB A1C): Hemoglobin A1C: 6.9 % — AB (ref 4.0–5.6)

## 2023-01-11 MED ORDER — ATORVASTATIN CALCIUM 80 MG PO TABS: 80.0000 mg | ORAL_TABLET | Freq: Every day | ORAL | 3 refills | Status: DC

## 2023-01-11 MED ORDER — RYBELSUS 7 MG PO TABS: 1.0000 | ORAL_TABLET | Freq: Every day | ORAL | 3 refills | Status: DC

## 2023-01-11 NOTE — Patient Instructions (Addendum)
Please continue: - Metformin 1000 2x a day with meals. - Jardiance 10 mg before b'fast - Rybelsus 7 mg before b'fast  Please return in 4-6 months with your sugar log.
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
## 2023-01-24 ENCOUNTER — Other Ambulatory Visit: Payer: Self-pay | Admitting: Internal Medicine

## 2023-01-25 ENCOUNTER — Other Ambulatory Visit: Payer: Self-pay | Admitting: Internal Medicine

## 2023-01-25 ENCOUNTER — Encounter: Payer: Self-pay | Admitting: Internal Medicine

## 2023-01-25 MED ORDER — NYSTATIN 100000 UNIT/GM EX CREA: 1.0000 | TOPICAL_CREAM | Freq: Two times a day (BID) | CUTANEOUS | 0 refills | Status: AC

## 2023-03-08 ENCOUNTER — Other Ambulatory Visit: Payer: Self-pay | Admitting: Internal Medicine

## 2023-04-04 ENCOUNTER — Other Ambulatory Visit: Payer: Self-pay

## 2023-04-27 ENCOUNTER — Telehealth: Payer: Self-pay | Admitting: Internal Medicine

## 2023-04-27 ENCOUNTER — Telehealth: Payer: 59 | Admitting: Physician Assistant

## 2023-04-27 DIAGNOSIS — U071 COVID-19: Secondary | ICD-10-CM

## 2023-04-27 MED ORDER — NIRMATRELVIR/RITONAVIR (PAXLOVID)TABLET: 3.0000 | ORAL_TABLET | Freq: Two times a day (BID) | ORAL | 0 refills | Status: AC

## 2023-04-27 NOTE — Patient Instructions (Signed)
Salvadore Oxford, thank you for joining Margaretann Loveless, PA-C for today's virtual visit.  While this provider is not your primary care provider (PCP), if your PCP is located in our provider database this encounter information will be shared with them immediately following your visit.   A [facility] MyChart account gives you access to today's visit and all your visits, tests, and labs performed at [facility] " click here if you don't have a [facility] MyChart account or go to [email or web address]  Consent: (Patient) Jerry Arroyo provided verbal consent for this virtual visit at the beginning of the encounter.  Current Medications:  Current Outpatient Medications:    nirmatrelvir/ritonavir (PAXLOVID) 20 x 150 MG & 10 x 100MG  TABS, Take 3 tablets by mouth 2 (two) times daily for 5 days. (Take nirmatrelvir 150 mg two tablets twice daily for 5 days and ritonavir 100 mg one tablet twice daily for 5 days) Patient GFR is greater than 60, Disp: 30 tablet, Rfl: 0   nystatin cream (MYCOSTATIN), Apply 1 Application topically 2 (two) times daily., Disp: 30 g, Rfl: 0   Accu-Chek Softclix Lancets lancets, Use as instructed 1x a day, Disp: 100 each, Rfl: 12   albuterol (VENTOLIN HFA) 108 (90 Base) MCG/ACT inhaler, INHALE 2 PUFFS INTO THE LUNGS EVERY 6 HOURS AS NEEDED FOR WHEEZING OR SHORTNESS OF BREATH, Disp: 6.7 g, Rfl: 2   ALPRAZolam (XANAX) 0.5 MG tablet, Take 1 tablet (0.5 mg total) by mouth 2 (two) times daily as needed for anxiety., Disp: 60 tablet, Rfl: 2   aspirin EC 81 MG tablet, Take 1 tablet (81 mg total) by mouth daily. Swallow whole., Disp: , Rfl:    atorvastatin (LIPITOR) 80 MG tablet, Take 1 tablet (80 mg total) by mouth daily., Disp: 90 tablet, Rfl: 3   Cyanocobalamin (VITAMIN B 12 PO), Take 1,000 mcg by mouth daily., Disp: , Rfl:    dicyclomine (BENTYL) 20 MG tablet, Take 1 tablet (20 mg total) by mouth 3 (three) times daily before meals. (Patient taking differently:  Take 20 mg by mouth daily as needed (IBS).), Disp: 90 tablet, Rfl: 1   EPINEPHrine (EPIPEN 2-PAK) 0.3 mg/0.3 mL IJ SOAJ injection, Inject 0.3 mLs (0.3 mg total) into the muscle as needed for anaphylaxis., Disp: 1 each, Rfl: 3   escitalopram (LEXAPRO) 20 MG tablet, Take one half tab daily x 3 days then increase to one tab daily (Patient not taking: Reported on 10/09/2022), Disp: 30 tablet, Rfl: 0   ezetimibe (ZETIA) 10 MG tablet, Take 1 tablet (10 mg total) by mouth daily., Disp: 90 tablet, Rfl: 3   fexofenadine (ALLEGRA) 180 MG tablet, Take 180 mg by mouth daily., Disp: , Rfl:    gabapentin (NEURONTIN) 300 MG capsule, TAKE 1 CAPSULE(300 MG) BY MOUTH AT BEDTIME (Patient taking differently: Take 200 mg by mouth at bedtime as needed. Patient reports taking 100 mg twice daily), Disp: 90 capsule, Rfl: 3   glucose blood (ACCU-CHEK GUIDE) test strip, Use as instructed 1x a day, Disp: 100 each, Rfl: 12   JARDIANCE 10 MG TABS tablet, TAKE 1 TABLET(10 MG) BY MOUTH DAILY BEFORE BREAKFAST, Disp: 90 tablet, Rfl: 3   lamoTRIgine (LAMICTAL) 150 MG tablet, Take 150 mg by mouth daily., Disp: , Rfl:    meloxicam (MOBIC) 15 MG tablet, Take 15 mg by mouth daily as needed for pain. (Patient not taking: Reported on 11/14/2022), Disp: , Rfl:    metFORMIN (GLUCOPHAGE) 1000 MG tablet, TAKE 1 TABLET BY MOUTH  TWICE  DAILY WITH MEALS, Disp: 180 tablet, Rfl: 3   metoprolol succinate (TOPROL XL) 25 MG 24 hr tablet, Take 1 tablet (25 mg total) by mouth daily., Disp: 30 tablet, Rfl: 11   Multiple Vitamin (MULTIVITAMIN) tablet, Take 1 tablet by mouth daily., Disp: , Rfl:    nitroGLYCERIN (NITROSTAT) 0.4 MG SL tablet, Place 1 tablet (0.4 mg total) under the tongue every 5 (five) minutes as needed for chest pain., Disp: 25 tablet, Rfl: 3   potassium citrate (UROCIT-K) 10 MEQ (1080 MG) SR tablet, Take 10 mEq by mouth daily., Disp: , Rfl:    Semaglutide (RYBELSUS) 7 MG TABS, Take 1 tablet (7 mg total) by mouth daily., Disp: 90 tablet, Rfl:  3   sertraline (ZOLOFT) 100 MG tablet, Take 200 mg by mouth daily., Disp: , Rfl:    Medications ordered in this encounter:  Meds ordered this encounter  Medications   nirmatrelvir/ritonavir (PAXLOVID) 20 x 150 MG & 10 x 100MG  TABS    Sig: Take 3 tablets by mouth 2 (two) times daily for 5 days. (Take nirmatrelvir 150 mg two tablets twice daily for 5 days and ritonavir 100 mg one tablet twice daily for 5 days) Patient GFR is greater than 60    Dispense:  30 tablet    Refill:  0    Order Specific Question:   Supervising Provider    Answer:   Merrilee Jansky [id number]     *If you need refills on other medications prior to your next appointment, please contact your pharmacy*  Follow-Up: Call back or seek an in-person evaluation if the symptoms worsen or if the condition fails to improve as anticipated.  [facility] [phone]  Care Instructions: Paxlovid (Nirmatrelvir; Ritonavir) Tablets What is this medication? NIRMATRELVIR; RITONAVIR (NIR ma TREL vir; ri TOE na veer) treats mild to moderate COVID-19. It may help people who are at high risk of developing severe illness. It works by limiting the spread of the virus in your body. This medicine may be used for other purposes; ask your health care provider or pharmacist if you have questions. COMMON BRAND NAME(S): PAXLOVID What should I tell my care team before I take this medication? They need to know if you have any of these conditions: Any allergies Any serious illness Kidney disease Liver disease An unusual or allergic reaction to nirmatrelvir, ritonavir, other medications, foods, dyes, or preservatives Pregnant or trying to get pregnant Breast-feeding How should I use this medication? This product contains 2 different medications that are packaged together. For the standard dose, take 2 pink tablets of nirmatrelvir with 1 white tablet of ritonavir (3 tablets total) by mouth with water twice daily. Talk to your  care team if you have kidney disease. You may need a different dose. Swallow the tablets whole. You can take it with or without food. If it upsets your stomach, take it with food. Take all of this medication unless your care team tells you to stop it early. Keep taking it even if you think you are better. Talk to your care team about the use of this medication in children. While it may be prescribed for children as young as 12 years for selected conditions, precautions do apply. Overdosage: If you think you have taken too much of this medicine contact a poison control center or emergency room at once. NOTE: This medicine is only for you. Do not share this medicine with others. What if I miss a dose? If  you miss a dose, take it as soon as you can unless it is more than 8 hours late. If it is more than 8 hours late, skip the missed dose. Take the next dose at the normal time. Do not take extra or 2 doses at the same time to make up for the missed dose. What may interact with this medication? Do not take this medication with any of the following: Alfuzosin Certain medications for anxiety or sleep, such as midazolam or triazolam Certain medications for cancer, such as apalutamide Certain medications for cholesterol, such as lovastatin or simvastatin Certain medications for irregular heartbeat, such as amiodarone, dronedarone, flecainide, propafenone, quinidine Certain medications for mental health conditions, such as lurasidone or pimozide Certain medications for seizures, such as carbamazepine, phenobarbital, phenytoin, primidone Colchicine Eletriptan Eplerenone Ergot alkaloids, such as dihydroergotamine, ergotamine, methylergonovine Finerenone Flibanserin Ivabradine Lomitapide Lumacaftor; ivacaftor Naloxegol Ranolazine Red Yeast Rice Rifampin Rifapentine Sildenafil Silodosin [facility] John's wort Tolvaptan Ubrogepant Voclosporin This medication may affect how other medications work, and  other medications may affect the way this medication works. Talk with your care team about all of the medications you take. They may suggest changes to your treatment plan to lower the risk of side effects and to make sure your medications work as intended. This list may not describe all possible interactions. Give your health care provider a list of all the medicines, herbs, non-prescription drugs, or dietary supplements you use. Also tell them if you smoke, drink alcohol, or use illegal drugs. Some items may interact with your medicine. What should I watch for while using this medication? Your condition will be monitored carefully while you are receiving this medication. Visit your care team for regular checkups. Tell your care team if your symptoms do not start to get better or if they get worse. If you have untreated HIV infection, this medication may lead to some HIV medications not working as well in the future. Estrogen and progestin hormones may not work as well while you are taking this medication. Your care team can help you find the contraceptive option that works for you. What side effects may I notice from receiving this medication? Side effects that you should report to your care team as soon as possible: Allergic reactions--skin rash, itching, hives, swelling of the face, lips, tongue, or throat Liver injury--right upper belly pain, loss of appetite, nausea, light-colored stool, dark yellow or brown urine, yellowing skin or eyes, unusual weakness or fatigue Redness, blistering, peeling, or loosening of the skin, including inside the mouth Side effects that usually do not require medical attention (report these to your care team if they continue or are bothersome): Change in taste Diarrhea General discomfort and fatigue Increase in blood pressure Muscle pain Nausea Stomach pain This list may not describe all possible side effects. Call your doctor for medical advice about side  effects. You may report side effects to FDA at 1-800-FDA-1088. Where should I keep my medication? Keep out of the reach of children and pets. Store at room temperature between 20 and 25 degrees C (68 and 77 degrees F). Get rid of any unused medication after the expiration date. To get rid of medications that are no longer needed or have expired: Take the medication to a medication take-back program. Check with your pharmacy or law enforcement to find a location. If you cannot return the medication, check the label or package insert to see if the medication should be thrown out in the garbage or flushed down  the toilet. If you are not sure, ask your care team. If it is safe to put it in the trash, take the medication out of the container. Mix the medication with cat litter, dirt, coffee grounds, or other unwanted substance. Seal the mixture in a bag or container. Put it in the trash. NOTE: This sheet is a summary. It may not cover all possible information. If you have questions about this medicine, talk to your doctor, pharmacist, or health care provider.  2024 Elsevier/Gold Standard (2022-10-09 00:00:00)    Isolation Instructions: You are to isolate at home until you have been fever free for at least 24 hours without a fever-reducing medication, and symptoms have been steadily improving for 24 hours. At that time,  you can end isolation but need to mask for an additional 5 days.   If you must be around other household members who do not have symptoms, you need to make sure that both you and the family members are masking consistently with a high-quality mask.  If you note any worsening of symptoms despite treatment, please seek an in-person evaluation ASAP. If you note any significant shortness of breath or any chest pain, please seek ER evaluation. Please do not delay care!   COVID-19: What to Do if You Are Sick If you test positive and are an older adult or someone who is at high risk of  getting very sick from COVID-19, treatment may be available. Contact a healthcare provider right away after a positive test to determine if you are eligible, even if your symptoms are mild right now. You can also visit a Test to Treat location and, if eligible, receive a prescription from a provider. Don't delay: Treatment must be started within the first few days to be effective. If you have a fever, cough, or other symptoms, you might have COVID-19. Most people have mild illness and are able to recover at home. If you are sick: Keep track of your symptoms. If you have an emergency warning sign (including trouble breathing), call 911. Steps to help prevent the spread of COVID-19 if you are sick If you are sick with COVID-19 or think you might have COVID-19, follow the steps below to care for yourself and to help protect other people in your home and community. Stay home except to get medical care Stay home. Most people with COVID-19 have mild illness and can recover at home without medical care. Do not leave your home, except to get medical care. Do not visit public areas and do not go to places where you are unable to wear a mask. Take care of yourself. Get rest and stay hydrated. Take over-the-counter medicines, such as acetaminophen, to help you feel better. Stay in touch with your doctor. Call before you get medical care. Be sure to get care if you have trouble breathing, or have any other emergency warning signs, or if you think it is an emergency. Avoid public transportation, ride-sharing, or taxis if possible. Get tested If you have symptoms of COVID-19, get tested. While waiting for test results, stay away from others, including staying apart from those living in your household. Get tested as soon as possible after your symptoms start. Treatments may be available for people with COVID-19 who are at risk for becoming very sick. Don't delay: Treatment must be started early to be effective--some  treatments must begin within 5 days of your first symptoms. Contact your healthcare provider right away if your test result is positive to  determine if you are eligible. Self-tests are one of several options for testing for the virus that causes COVID-19 and may be more convenient than laboratory-based tests and point-of-care tests. Ask your healthcare provider or your local health department if you need help interpreting your test results. You can visit your state, tribal, local, and territorial health department's website to look for the latest local information on testing sites. Separate yourself from other people As much as possible, stay in a specific room and away from other people and pets in your home. If possible, you should use a separate bathroom. If you need to be around other people or animals in or outside of the home, wear a well-fitting mask. Tell your close contacts that they may have been exposed to COVID-19. An infected person can spread COVID-19 starting 48 hours (or 2 days) before the person has any symptoms or tests positive. By letting your close contacts know they may have been exposed to COVID-19, you are helping to protect everyone. See COVID-19 and Animals if you have questions about pets. If you are diagnosed with COVID-19, someone from the health department may call you. Answer the call to slow the spread. Monitor your symptoms Symptoms of COVID-19 include fever, cough, or other symptoms. Follow care instructions from your healthcare provider and local health department. Your local health authorities may give instructions on checking your symptoms and reporting information. When to seek emergency medical attention Look for emergency warning signs* for COVID-19. If someone is showing any of these signs, seek emergency medical care immediately: Trouble breathing Persistent pain or pressure in the chest New confusion Inability to wake or stay awake Pale, gray, or  blue-colored skin, lips, or nail beds, depending on skin tone *This list is not all possible symptoms. Please call your medical provider for any other symptoms that are severe or concerning to you. Call 911 or call ahead to your local emergency facility: Notify the operator that you are seeking care for someone who has or may have COVID-19. Call ahead before visiting your doctor Call ahead. Many medical visits for routine care are being postponed or done by phone or telemedicine. If you have a medical appointment that cannot be postponed, call your doctor's office, and tell them you have or may have COVID-19. This will help the office protect themselves and other patients. If you are sick, wear a well-fitting mask You should wear a mask if you must be around other people or animals, including pets (even at home). Wear a mask with the best fit, protection, and comfort for you. You don't need to wear the mask if you are alone. If you can't put on a mask (because of trouble breathing, for example), cover your coughs and sneezes in some other way. Try to stay at least 6 feet away from other people. This will help protect the people around you. Masks should not be placed on young children under age 11 years, anyone who has trouble breathing, or anyone who is not able to remove the mask without help. Cover your coughs and sneezes Cover your mouth and nose with a tissue when you cough or sneeze. Throw away used tissues in a lined trash can. Immediately wash your hands with soap and water for at least 20 seconds. If soap and water are not available, clean your hands with an alcohol-based hand sanitizer that contains at least 60% alcohol. Clean your hands often Wash your hands often with soap and water for at least 20  seconds. This is especially important after blowing your nose, coughing, or sneezing; going to the bathroom; and before eating or preparing food. Use hand sanitizer if soap and water are not  available. Use an alcohol-based hand sanitizer with at least 60% alcohol, covering all surfaces of your hands and rubbing them together until they feel dry. Soap and water are the best option, especially if hands are visibly dirty. Avoid touching your eyes, nose, and mouth with unwashed hands. Handwashing Tips Avoid sharing personal household items Do not share dishes, drinking glasses, cups, eating utensils, towels, or bedding with other people in your home. Wash these items thoroughly after using them with soap and water or put in the dishwasher. Clean surfaces in your home regularly Clean and disinfect high-touch surfaces (for example, doorknobs, tables, handles, light switches, and countertops) in your "sick room" and bathroom. In shared spaces, you should clean and disinfect surfaces and items after each use by the person who is ill. If you are sick and cannot clean, a caregiver or other person should only clean and disinfect the area around you (such as your bedroom and bathroom) on an as needed basis. Your caregiver/other person should wait as long as possible (at least several hours) and wear a mask before entering, cleaning, and disinfecting shared spaces that you use. Clean and disinfect areas that may have blood, stool, or body fluids on them. Use household cleaners and disinfectants. Clean visible dirty surfaces with household cleaners containing soap or detergent. Then, use a household disinfectant. Use a product from [employer] List N: Disinfectants for Coronavirus (COVID-19). Be sure to follow the instructions on the label to ensure safe and effective use of the product. Many products recommend keeping the surface wet with a disinfectant for a certain period of time (look at "contact time" on the product label). You may also need to wear personal protective equipment, such as gloves, depending on the directions on the product label. Immediately after disinfecting, wash your hands with soap  and water for 20 seconds. For completed guidance on cleaning and disinfecting your home, visit Complete Disinfection Guidance. Take steps to improve ventilation at home Improve ventilation (air flow) at home to help prevent from spreading COVID-19 to other people in your household. Clear out COVID-19 virus particles in the air by opening windows, using air filters, and turning on fans in your home. Use this interactive tool to learn how to improve air flow in your home. When you can be around others after being sick with COVID-19 Deciding when you can be around others is different for different situations. Find out when you can safely end home isolation. For any additional questions about your care, contact your healthcare provider or state or local health department. 11/23/2020 Content source: [facility] ([facility]), Division of Viral Diseases This information is not intended to replace advice given to you by your health care provider. Make sure you discuss any questions you have with your health care provider. Document Revised: 01/06/2021 Document Reviewed: 01/06/2021 Elsevier Patient Education  2022 [employer].     If you have been instructed to have an in-person evaluation today at a local Urgent Care facility, please use the link below. It will take you to a list of all of our available [facility], including address, phone number and hours of operation. Please do not delay care.  [facility]  If you or a family member do not have a primary care  provider, use the link below to schedule a visit and establish care. When you choose a [facility] primary care physician or advanced practice provider, you gain a long-term partner in health. Find a Primary Care Provider  Learn more about [facility]'s in-office and virtual care options: [facility] - Get Care Now

## 2023-04-27 NOTE — Progress Notes (Signed)
Virtual Visit Consent   Jerry Arroyo, you are scheduled for a virtual visit with a [facility] provider today. Just as with appointments in the office, your consent must be obtained to participate. Your consent will be active for this visit and any virtual visit you may have with one of our providers in the next 365 days. If you have a MyChart account, a copy of this consent can be sent to you electronically.  As this is a virtual visit, video technology does not allow for your provider to perform a traditional examination. This may limit your provider's ability to fully assess your condition. If your provider identifies any concerns that need to be evaluated in person or the need to arrange testing (such as labs, EKG, etc.), we will make arrangements to do so. Although advances in technology are sophisticated, we cannot ensure that it will always work on either your end or our end. If the connection with a video visit is poor, the visit may have to be switched to a telephone visit. With either a video or telephone visit, we are not always able to ensure that we have a secure connection.  By engaging in this virtual visit, you consent to the provision of healthcare and authorize for your insurance to be billed (if applicable) for the services provided during this visit. Depending on your insurance coverage, you may receive a charge related to this service.  I need to obtain your verbal consent now. Are you willing to proceed with your visit today? Jerry Arroyo has provided verbal consent on 04/27/2023 for a virtual visit (video or telephone). Margaretann Loveless, PA-C  Date: 04/27/2023 10:00 AM  Virtual Visit via Video Note   I, Margaretann Loveless, connected with  Jerry Arroyo  ([id number], [birth year]) on 04/27/23 at 10:00 AM EDT by a video-enabled telemedicine application and verified that I am speaking with the correct person using two identifiers.  Location: Patient: Virtual Visit  Location Patient: Home Provider: Virtual Visit Location Provider: Home Office   I discussed the limitations of evaluation and management by telemedicine and the availability of in person appointments. The patient expressed understanding and agreed to proceed.    History of Present Illness: Jerry Arroyo is a 57 y.o. who identifies as a male who was assigned male at birth, and is being seen today for Covid 38.  HPI: URI  This is a new problem. The current episode started yesterday (Symptoms started yesterday, tested positive this morning; had positive exposure from Daughter last week). The problem has been gradually worsening. There has been no fever. Associated symptoms include congestion, coughing (deep breathing causes cough), diarrhea, ear pain, headaches, a plugged ear sensation, rhinorrhea, sinus pain and wheezing (mild). Pertinent negatives include no chest pain, nausea, sore throat or vomiting. Associated symptoms comments: Myalgias. Treatments tried: dayquil, tylenol. The treatment provided no relief.    Problems:  Patient Active Problem List   Diagnosis Date Noted   Unstable angina (HCC) 10/11/2022   Chest pain of uncertain etiology 10/08/2022   History of colonic polyps    Benign neoplasm of rectosigmoid junction    Type 2 diabetes mellitus with peripheral neuropathy (HCC) 04/19/2021   Generalized osteoarthritis 04/19/2021   Degenerative disc disease, lumbar 04/19/2021   Heel spur, left 04/19/2021   History of anxiety 04/19/2021   Peripheral neuropathy 04/19/2021   Hymenoptera allergy 04/21/2020   Cervical disc disease 04/02/2017   Hyperlipidemia 12/15/2014   History of kidney stones 07/22/2013  Allergies:  Allergies  Allergen Reactions   Bee Venom Swelling   Medications:  Current Outpatient Medications:    nystatin cream (MYCOSTATIN), Apply 1 Application topically 2 (two) times daily., Disp: 30 g, Rfl: 0   Accu-Chek Softclix Lancets lancets, Use as instructed 1x a  day, Disp: 100 each, Rfl: 12   albuterol (VENTOLIN HFA) 108 (90 Base) MCG/ACT inhaler, INHALE 2 PUFFS INTO THE LUNGS EVERY 6 HOURS AS NEEDED FOR WHEEZING OR SHORTNESS OF BREATH, Disp: 6.7 g, Rfl: 2   ALPRAZolam (XANAX) 0.5 MG tablet, Take 1 tablet (0.5 mg total) by mouth 2 (two) times daily as needed for anxiety., Disp: 60 tablet, Rfl: 2   aspirin EC 81 MG tablet, Take 1 tablet (81 mg total) by mouth daily. Swallow whole., Disp: , Rfl:    atorvastatin (LIPITOR) 80 MG tablet, Take 1 tablet (80 mg total) by mouth daily., Disp: 90 tablet, Rfl: 3   Cyanocobalamin (VITAMIN B 12 PO), Take 1,000 mcg by mouth daily., Disp: , Rfl:    dicyclomine (BENTYL) 20 MG tablet, Take 1 tablet (20 mg total) by mouth 3 (three) times daily before meals. (Patient taking differently: Take 20 mg by mouth daily as needed (IBS).), Disp: 90 tablet, Rfl: 1   EPINEPHrine (EPIPEN 2-PAK) 0.3 mg/0.3 mL IJ SOAJ injection, Inject 0.3 mLs (0.3 mg total) into the muscle as needed for anaphylaxis., Disp: 1 each, Rfl: 3   escitalopram (LEXAPRO) 20 MG tablet, Take one half tab daily x 3 days then increase to one tab daily (Patient not taking: Reported on 10/09/2022), Disp: 30 tablet, Rfl: 0   ezetimibe (ZETIA) 10 MG tablet, Take 1 tablet (10 mg total) by mouth daily., Disp: 90 tablet, Rfl: 3   fexofenadine (ALLEGRA) 180 MG tablet, Take 180 mg by mouth daily., Disp: , Rfl:    gabapentin (NEURONTIN) 300 MG capsule, TAKE 1 CAPSULE(300 MG) BY MOUTH AT BEDTIME (Patient taking differently: Take 200 mg by mouth at bedtime as needed. Patient reports taking 100 mg twice daily), Disp: 90 capsule, Rfl: 3   glucose blood (ACCU-CHEK GUIDE) test strip, Use as instructed 1x a day, Disp: 100 each, Rfl: 12   JARDIANCE 10 MG TABS tablet, TAKE 1 TABLET(10 MG) BY MOUTH DAILY BEFORE BREAKFAST, Disp: 90 tablet, Rfl: 3   lamoTRIgine (LAMICTAL) 150 MG tablet, Take 150 mg by mouth daily., Disp: , Rfl:    meloxicam (MOBIC) 15 MG tablet, Take 15 mg by mouth daily as  needed for pain. (Patient not taking: Reported on 11/14/2022), Disp: , Rfl:    metFORMIN (GLUCOPHAGE) 1000 MG tablet, TAKE 1 TABLET BY MOUTH TWICE  DAILY WITH MEALS, Disp: 180 tablet, Rfl: 3   metoprolol succinate (TOPROL XL) 25 MG 24 hr tablet, Take 1 tablet (25 mg total) by mouth daily., Disp: 30 tablet, Rfl: 11   Multiple Vitamin (MULTIVITAMIN) tablet, Take 1 tablet by mouth daily., Disp: , Rfl:    nitroGLYCERIN (NITROSTAT) 0.4 MG SL tablet, Place 1 tablet (0.4 mg total) under the tongue every 5 (five) minutes as needed for chest pain., Disp: 25 tablet, Rfl: 3   potassium citrate (UROCIT-K) 10 MEQ (1080 MG) SR tablet, Take 10 mEq by mouth daily., Disp: , Rfl:    Semaglutide (RYBELSUS) 7 MG TABS, Take 1 tablet (7 mg total) by mouth daily., Disp: 90 tablet, Rfl: 3   sertraline (ZOLOFT) 100 MG tablet, Take 200 mg by mouth daily., Disp: , Rfl:   Observations/Objective: Patient is well-developed, well-nourished in no acute distress.  Resting comfortably at home.  Head is normocephalic, atraumatic.  No labored breathing. Speech is clear and coherent with logical content.  Patient is alert and oriented at baseline.    Assessment and Plan: There are no diagnoses linked to this encounter. - Continue OTC symptomatic management of choice - Will send OTC vitamins and supplement information through AVS - Paxlovid prescribed - Patient enrolled in MyChart symptom monitoring - Push fluids - Rest as needed - Discussed return precautions and when to seek in-person evaluation, sent via AVS as well   Follow Up Instructions: I discussed the assessment and treatment plan with the patient. The patient was provided an opportunity to ask questions and all were answered. The patient agreed with the plan and demonstrated an understanding of the instructions.  A copy of instructions were sent to the patient via MyChart unless otherwise noted below.    The patient was advised to call back or seek an in-person  evaluation if the symptoms worsen or if the condition fails to improve as anticipated.  Time:  I spent 10 minutes with the patient via telehealth technology discussing the above problems/concerns.    Margaretann Loveless, PA-C

## 2023-04-27 NOTE — Telephone Encounter (Signed)
Rainier Binger [phone]  Lawson Fiscal called to say Nedra Hai had tested positive for COVID this morning, he started feeling bad last night, he has body aches, head congestion and a cough. Daughter had COVID last week. We have advise to Urgent Care video visit thru My Chart because Dr Lenord Fellers is out of office today.

## 2023-06-22 ENCOUNTER — Other Ambulatory Visit: Payer: Self-pay | Admitting: Internal Medicine

## 2023-06-22 NOTE — Telephone Encounter (Signed)
Rybelsus refill request complete

## 2023-06-26 ENCOUNTER — Other Ambulatory Visit (INDEPENDENT_AMBULATORY_CARE_PROVIDER_SITE_OTHER): Payer: 59

## 2023-06-26 ENCOUNTER — Encounter: Payer: Self-pay | Admitting: Orthopaedic Surgery

## 2023-06-26 ENCOUNTER — Ambulatory Visit: Payer: 59 | Admitting: Orthopaedic Surgery

## 2023-06-26 VITALS — BP 129/77 | HR 60 | Ht 76.0 in | Wt 240.0 lb

## 2023-06-26 DIAGNOSIS — M545 Low back pain, unspecified: Secondary | ICD-10-CM

## 2023-06-26 DIAGNOSIS — G8929 Other chronic pain: Secondary | ICD-10-CM

## 2023-06-26 DIAGNOSIS — M51369 Other intervertebral disc degeneration, lumbar region without mention of lumbar back pain or lower extremity pain: Secondary | ICD-10-CM

## 2023-06-26 NOTE — Progress Notes (Signed)
Office Visit Note   Patient: Jerry Arroyo           Date of Birth: 1966/08/08           MRN: [id number] Visit Date: 06/26/2023              Requested by: Margaree Mackintosh, MD [address] [place],  [place] [address] PCP: Margaree Mackintosh, MD   Assessment & Plan: Visit Diagnoses:  1. Chronic right-sided low back pain, unspecified whether sciatica present   2. Degeneration of intervertebral disc of lumbar region without discogenic back pain or lower extremity pain     Plan: Will set patient up for some physical therapy recheck him in 6 -7 weeks.  Pathophysiology discussed.  Follow-Up Instructions: No follow-ups on file.   Orders:  Orders Placed This Encounter  Procedures   XR Lumbar Spine 2-3 Views   Ambulatory referral to Physical Therapy   No orders of the defined types were placed in this encounter.     Procedures: No procedures performed   Clinical Data: No additional findings.   Subjective: Chief Complaint  Patient presents with   Lower Back - Pain    HPI 57 year old male here with his wife with low back pain worse over the last year prior to that he had intermittent symptoms he has a farm with animals .  Does all the upkeep yard work.  Pain with turning twisting.  Symptoms have been intermittent episodic sometimes severe other times mild.  He has a chronic numbness and tingling in his left leg for years but primarily as pain has been stabbing on the right side that radiates into his buttocks.  He points over the right SI joint area where he has the most pain.  History of lumbar disc degeneration.  Review of Systems history of cervical disc disease peripheral neuropathy type 2 diabetes angina kidney stones.  All systems noncontributory to HPI.   Objective: Vital Signs: BP 129/77   Pulse 60   Ht 6\' 4"  (1.93 m)   Wt 240 lb (108.9 kg)   BMI 29.21 kg/m   Physical Exam Constitutional:      Appearance: He is well-developed.  HENT:     Head:  Normocephalic and atraumatic.     Right Ear: External ear normal.     Left Ear: External ear normal.  Eyes:     Pupils: Pupils are equal, round, and reactive to light.  Neck:     Thyroid: No thyromegaly.     Trachea: No tracheal deviation.  Cardiovascular:     Rate and Rhythm: Normal rate.  Pulmonary:     Effort: Pulmonary effort is normal.     Breath sounds: No wheezing.  Abdominal:     General: Bowel sounds are normal.     Palpations: Abdomen is soft.  Musculoskeletal:     Cervical back: Neck supple.  Skin:    General: Skin is warm and dry.     Capillary Refill: Capillary refill takes less than 2 seconds.  Neurological:     Mental Status: He is alert and oriented to person, place, and time.  Psychiatric:        Behavior: Behavior normal.        Thought Content: Thought content normal.        Judgment: Judgment normal.     Ortho Exam negative straight leg raising 90 degrees he is able to heel and toe walk but has pain with heel walking.  Positive  popliteal compression test on the right negative on the left.  Some decrease sensation lateral thigh lateral calf on the left.  Specialty Comments:  No specialty comments available.  Imaging: XR Lumbar Spine 2-3 Views  Result Date: 06/26/2023 AP lateral lumbar images are obtained and reviewed.  This shows slight narrowing L4-5 disc space without listhesis.  Mild endplate irregularity. Impression: Mild narrowing L4-5 otherwise unremarkable lumbar radiographs.    PMFS History: Patient Active Problem List   Diagnosis Date Noted   Unstable angina (HCC) 10/11/2022   Chest pain of uncertain etiology 10/08/2022   History of colonic polyps    Benign neoplasm of rectosigmoid junction    Type 2 diabetes mellitus with peripheral neuropathy (HCC) 04/19/2021   Generalized osteoarthritis 04/19/2021   Degenerative disc disease, lumbar 04/19/2021   Heel spur, left 04/19/2021   History of anxiety 04/19/2021   Peripheral neuropathy  04/19/2021   Hymenoptera allergy 04/21/2020   Cervical disc disease 04/02/2017   Hyperlipidemia 12/15/2014   History of kidney stones 07/22/2013   Past Medical History:  Diagnosis Date   Allergy 09/20/2021   seasonal and environmental and Bee venom   Anxiety    Asthma    Diabetes mellitus without complication (HCC)    History of kidney stones    Hyperlipidemia     Family History  Problem Relation Age of Onset   Heart failure Mother    Healthy Mother    Allergic rhinitis Mother    Heart disease Father 38       CABG   Colon polyps Father    Healthy Father    Colon polyps Paternal Grandmother    Colon cancer Paternal Grandmother    Esophageal cancer Neg Hx    Stomach cancer Neg Hx    Rectal cancer Neg Hx     Past Surgical History:  Procedure Laterality Date   ADENOIDECTOMY     FLEXIBLE SIGMOIDOSCOPY N/A 11/03/2021   Procedure: FLEXIBLE SIGMOIDOSCOPY;  Surgeon: Benancio Deeds, MD;  Location: [facility];  Service: Gastroenterology;  Laterality: N/A;   HEMOSTASIS CLIP PLACEMENT  11/03/2021   Procedure: HEMOSTASIS CLIP PLACEMENT;  Surgeon: Benancio Deeds, MD;  Location: [facility];  Service: Gastroenterology;;   HERNIA REPAIR     left and right   LAMINECTOMY AND MICRODISCECTOMY CERVICAL SPINE  2018   C6-7   LEFT HEART CATH AND CORONARY ANGIOGRAPHY Left 10/11/2022   Procedure: LEFT HEART CATH AND CORONARY ANGIOGRAPHY;  Surgeon: Yvonne Kendall, MD;  Location: [facility];  Service: Cardiovascular;  Laterality: Left;   POLYPECTOMY  11/03/2021   Procedure: POLYPECTOMY;  Surgeon: Benancio Deeds, MD;  Location: [facility];  Service: Gastroenterology;;   Sunnie Nielsen LIFTING INJECTION  11/03/2021   Procedure: SUBMUCOSAL LIFTING INJECTION;  Surgeon: Benancio Deeds, MD;  Location: [facility];  Service: Gastroenterology;;   TONSILLECTOMY     TYMPANOSTOMY TUBE PLACEMENT     Social History   Occupational History   Not on file  Tobacco Use   Smoking  status: Never   Smokeless tobacco: Current    Types: Snuff  Vaping Use   Vaping status: Never Used  Substance and Sexual Activity   Alcohol use: Never   Drug use: Never   Sexual activity: Not on file

## 2023-07-16 ENCOUNTER — Ambulatory Visit (INDEPENDENT_AMBULATORY_CARE_PROVIDER_SITE_OTHER): Payer: 59 | Admitting: Internal Medicine

## 2023-07-16 ENCOUNTER — Encounter: Payer: Self-pay | Admitting: Internal Medicine

## 2023-07-16 VITALS — BP 110/60 | HR 86 | Ht 76.0 in | Wt 239.2 lb

## 2023-07-16 DIAGNOSIS — E785 Hyperlipidemia, unspecified: Secondary | ICD-10-CM | POA: Diagnosis not present

## 2023-07-16 DIAGNOSIS — Z7985 Long-term (current) use of injectable non-insulin antidiabetic drugs: Secondary | ICD-10-CM | POA: Diagnosis not present

## 2023-07-16 DIAGNOSIS — E1142 Type 2 diabetes mellitus with diabetic polyneuropathy: Secondary | ICD-10-CM

## 2023-07-16 LAB — POCT GLYCOSYLATED HEMOGLOBIN (HGB A1C): Hemoglobin A1C: 7.3 % — AB (ref 4.0–5.6)

## 2023-07-16 MED ORDER — SEMAGLUTIDE(0.25 OR 0.5MG/DOS) 2 MG/3ML ~~LOC~~ SOPN: 0.5000 mg | PEN_INJECTOR | SUBCUTANEOUS | 3 refills | Status: DC

## 2023-07-16 MED ORDER — EZETIMIBE 10 MG PO TABS: 10.0000 mg | ORAL_TABLET | Freq: Every day | ORAL | 3 refills | Status: DC

## 2023-07-16 NOTE — Patient Instructions (Addendum)
Please continue: - Metformin 1000 2x a day with meals. - Jardiance 10 mg before b'fast  Change Rybelsus to: - Ozempic 0.25 mg weekly and increase to 0.5 mg weekly after 2 weeks  Check sugars 1x a day, rotating check times.  Please return in 4 months with your sugar log.
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
## 2023-07-17 LAB — COMPREHENSIVE METABOLIC PANEL
ALT: 32 U/L (ref 0–53)
AST: 22 U/L (ref 0–37)
Albumin: 4.7 g/dL (ref 3.5–5.2)
Alkaline Phosphatase: 67 U/L (ref 39–117)
BUN: 9 mg/dL (ref 6–23)
CO2: 26 meq/L (ref 19–32)
Calcium: 9.5 mg/dL (ref 8.4–10.5)
Chloride: 104 meq/L (ref 96–112)
Creatinine, Ser: 0.84 mg/dL (ref 0.40–1.50)
GFR: 95.64 mL/min (ref >60.00)
Glucose, Bld: 105 mg/dL — ABNORMAL HIGH (ref 70–99)
Potassium: 3.9 meq/L (ref 3.5–5.1)
Sodium: 141 meq/L (ref 135–145)
Total Bilirubin: 0.4 mg/dL (ref 0.2–1.2)
Total Protein: 7.7 g/dL (ref 6.0–8.3)

## 2023-07-17 LAB — LIPID PANEL
Cholesterol: 158 mg/dL (ref 0–200)
HDL: 42.6 mg/dL (ref >39.00)
LDL Cholesterol: 69 mg/dL (ref 0–99)
NonHDL: 115.68
Total CHOL/HDL Ratio: 4
Triglycerides: 235 mg/dL — ABNORMAL HIGH (ref 0.0–149.0)
VLDL: 47 mg/dL — ABNORMAL HIGH (ref 0.0–40.0)

## 2023-07-17 LAB — MICROALBUMIN / CREATININE URINE RATIO
Creatinine,U: 119.3 mg/dL
Microalb Creat Ratio: 1.8 mg/g (ref 0.0–30.0)
Microalb, Ur: 2.1 mg/dL — ABNORMAL HIGH (ref 0.0–1.9)

## 2023-07-26 ENCOUNTER — Other Ambulatory Visit (HOSPITAL_COMMUNITY): Payer: Self-pay

## 2023-08-14 ENCOUNTER — Ambulatory Visit (INDEPENDENT_AMBULATORY_CARE_PROVIDER_SITE_OTHER): Payer: 59 | Admitting: Physical Therapy

## 2023-08-14 ENCOUNTER — Other Ambulatory Visit: Payer: Self-pay

## 2023-08-14 ENCOUNTER — Encounter: Payer: Self-pay | Admitting: Physical Therapy

## 2023-08-14 DIAGNOSIS — R2681 Unsteadiness on feet: Secondary | ICD-10-CM

## 2023-08-14 DIAGNOSIS — M5459 Other low back pain: Secondary | ICD-10-CM

## 2023-08-14 DIAGNOSIS — M6281 Muscle weakness (generalized): Secondary | ICD-10-CM | POA: Diagnosis not present

## 2023-08-14 DIAGNOSIS — R29898 Other symptoms and signs involving the musculoskeletal system: Secondary | ICD-10-CM
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
## 2023-09-18 ENCOUNTER — Telehealth: Payer: Self-pay | Admitting: Orthopaedic Surgery

## 2023-09-18 DIAGNOSIS — M51369 Other intervertebral disc degeneration, lumbar region without mention of lumbar back pain or lower extremity pain: Secondary | ICD-10-CM

## 2023-09-18 DIAGNOSIS — M545 Low back pain, unspecified: Secondary | ICD-10-CM

## 2023-09-18 NOTE — Addendum Note (Signed)
 Addended by: Rogers Seeds on: 09/18/2023 11:29 AM   Modules accepted: Orders

## 2023-09-18 NOTE — Telephone Encounter (Signed)
 Patient called and needs to know if he needs a MRI done. He was seen for his lower back and then went to PT for a month but nothing is working. CB#[phone]

## 2023-09-18 NOTE — Telephone Encounter (Signed)
 Referral entered. I called patient and advised. He is aware he will need to call our office once MRI is scheduled so that we can set him up for return appointment for MRI review.

## 2023-09-25 ENCOUNTER — Encounter: Payer: Self-pay | Admitting: Orthopaedic Surgery

## 2023-09-27 ENCOUNTER — Other Ambulatory Visit: Payer: Self-pay | Admitting: Internal Medicine

## 2023-09-28 ENCOUNTER — Ambulatory Visit
Admission: RE | Admit: 2023-09-28 | Discharge: 2023-09-28 | Disposition: A | Discharge status: Home / Self Care | Payer: 59 | Source: Ambulatory Visit | Attending: Orthopaedic Surgery | Admitting: Orthopaedic Surgery

## 2023-09-28 DIAGNOSIS — M51369 Other intervertebral disc degeneration, lumbar region without mention of lumbar back pain or lower extremity pain: Secondary | ICD-10-CM

## 2023-09-28 DIAGNOSIS — M545 Low back pain, unspecified: Secondary | ICD-10-CM

## 2023-10-05 ENCOUNTER — Ambulatory Visit (INDEPENDENT_AMBULATORY_CARE_PROVIDER_SITE_OTHER): Payer: 59 | Admitting: Orthopaedic Surgery

## 2023-10-05 DIAGNOSIS — M51369 Other intervertebral disc degeneration, lumbar region without mention of lumbar back pain or lower extremity pain: Secondary | ICD-10-CM | POA: Diagnosis not present

## 2023-10-05 NOTE — Progress Notes (Unsigned)
Office Visit Note   Patient: Jerry Arroyo           Date of Birth: 08-05-1966           MRN: [id number] Visit Date: 10/05/2023              Requested by: Margaree Mackintosh, MD [address] [place],  [place] [address] PCP: Margaree Mackintosh, MD   Assessment & Plan: Visit Diagnoses: No diagnosis found.  Plan: ***  Follow-Up Instructions: No follow-ups on file.   Orders:  No orders of the defined types were placed in this encounter.  No orders of the defined types were placed in this encounter.     Procedures: No procedures performed   Clinical Data: No additional findings.   Subjective: Chief Complaint  Patient presents with   Lower Back - Pain, Follow-up    MRI review    HPI  Review of Systems   Objective: Vital Signs: There were no vitals taken for this visit.  Physical Exam  Ortho Exam  Specialty Comments:  No specialty comments available.  Imaging: arrative & Impression  CLINICAL DATA:  Low back pain, symptoms persist with > 6 wks treatment. Chronic low back pain, failed conservative treatment. Bilateral buttock and left hip pain.   EXAM: MRI LUMBAR SPINE WITHOUT CONTRAST   TECHNIQUE: Multiplanar, multisequence MR imaging of the lumbar spine was performed. No intravenous contrast was administered.   COMPARISON:  Lumbar spine radiographs 06/26/2023   FINDINGS: Segmentation:  Standard.   Alignment:  Normal.   Vertebrae: No fracture or suspicious marrow lesion. Moderate right greater than left facet edema at L4-5.   Conus medullaris and cauda equina: Conus extends to the lower T12 level. Conus and cauda equina appear normal.   Paraspinal and other soft tissues: Unremarkable.   Disc levels:   T12-L1 and L1-2: Negative.   L2-3: Disc desiccation. Mild disc bulging, a small left foraminal disc protrusion, and mild facet hypertrophy without significant stenosis.   L3-4: Disc desiccation. Disc bulging and mild facet  hypertrophy result in mild bilateral lateral recess stenosis and mild bilateral neural foraminal stenosis without spinal stenosis.   L4-5: Disc desiccation. Circumferential disc bulging, a superimposed central disc protrusion, and severe facet arthrosis result in mild spinal stenosis, moderate right greater than left lateral recess stenosis, and mild-to-moderate bilateral neural foraminal stenosis. The L5 nerve roots may be affected in the lateral recesses.   L5-S1: Normal disc.  Mild facet hypertrophy without stenosis.   IMPRESSION: 1. 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 2. Mild spinal stenosis, moderate lateral recess stenosis, and mild-to-moderate neural foraminal stenosis at L4-5. 3. Mild lateral recess and neural foraminal stenosis at L3-4.     Electronically Signed   By: Sebastian Ache M.D.   On: 10/05/2023 15:11       PMFS History: Patient Active Problem List   Diagnosis Date Noted   Unstable angina (HCC) 10/11/2022   Chest pain of uncertain etiology 10/08/2022   History of colonic polyps    Benign neoplasm of rectosigmoid junction    Type 2 diabetes mellitus with peripheral neuropathy (HCC) 04/19/2021   Generalized osteoarthritis 04/19/2021   Degenerative disc disease, lumbar 04/19/2021   Heel spur, left 04/19/2021   History of anxiety 04/19/2021   Peripheral neuropathy 04/19/2021   Hymenoptera allergy 04/21/2020   Cervical disc disease 04/02/2017   Hyperlipidemia 12/15/2014   History of kidney stones 07/22/2013   Past Medical  History:  Diagnosis Date   Allergy 09/20/2021   seasonal and environmental and Bee venom   Anxiety    Asthma    Diabetes mellitus without complication (HCC)    History of kidney stones    Hyperlipidemia     Family History  Problem Relation Age of Onset   Heart failure Mother    Healthy Mother    Allergic rhinitis Mother    Heart disease Father 64       CABG   Colon polyps Father    Healthy  Father    Colon polyps Paternal Grandmother    Colon cancer Paternal Grandmother    Esophageal cancer Neg Hx    Stomach cancer Neg Hx    Rectal cancer Neg Hx     Past Surgical History:  Procedure Laterality Date   ADENOIDECTOMY     FLEXIBLE SIGMOIDOSCOPY N/A 11/03/2021   Procedure: FLEXIBLE SIGMOIDOSCOPY;  Surgeon: Benancio Deeds, MD;  Location: [facility];  Service: Gastroenterology;  Laterality: N/A;   HEMOSTASIS CLIP PLACEMENT  11/03/2021   Procedure: HEMOSTASIS CLIP PLACEMENT;  Surgeon: Benancio Deeds, MD;  Location: [facility];  Service: Gastroenterology;;   HERNIA REPAIR     left and right   LAMINECTOMY AND MICRODISCECTOMY CERVICAL SPINE  2018   C6-7   LEFT HEART CATH AND CORONARY ANGIOGRAPHY Left 10/11/2022   Procedure: LEFT HEART CATH AND CORONARY ANGIOGRAPHY;  Surgeon: Yvonne Kendall, MD;  Location: [facility];  Service: Cardiovascular;  Laterality: Left;   POLYPECTOMY  11/03/2021   Procedure: POLYPECTOMY;  Surgeon: Benancio Deeds, MD;  Location: [facility];  Service: Gastroenterology;;   Sunnie Nielsen LIFTING INJECTION  11/03/2021   Procedure: SUBMUCOSAL LIFTING INJECTION;  Surgeon: Benancio Deeds, MD;  Location: [facility];  Service: Gastroenterology;;   TONSILLECTOMY     TYMPANOSTOMY TUBE PLACEMENT     Social History   Occupational History   Not on file  Tobacco Use   Smoking status: Never   Smokeless tobacco: Current    Types: Snuff  Vaping Use   Vaping status: Never Used  Substance and Sexual Activity   Alcohol use: Never   Drug use: Never   Sexual activity: Not on file

## 2023-10-08 ENCOUNTER — Telehealth: Payer: Self-pay | Admitting: Physical Medicine and Rehabilitation

## 2023-10-08 NOTE — Telephone Encounter (Signed)
Patient called needing to schedule an appointment with Dr. Alvester Morin for an injection in his back. Patient said he was referred by Dr. Ophelia Charter. The number to contact patient is [phone]

## 2023-10-09 ENCOUNTER — Telehealth: Payer: Self-pay | Admitting: Physical Medicine and Rehabilitation

## 2023-10-09 NOTE — Telephone Encounter (Signed)
Patient called to schedule an appointment with Dr. Alvester Morin for his back.   The number to contact patient is [phone]

## 2023-10-18 ENCOUNTER — Ambulatory Visit: Payer: 59 | Admitting: Physical Medicine and Rehabilitation

## 2023-10-18 ENCOUNTER — Other Ambulatory Visit: Payer: Self-pay

## 2023-10-18 VITALS — BP 137/73 | HR 79

## 2023-10-18 DIAGNOSIS — M5416 Radiculopathy, lumbar region: Secondary | ICD-10-CM | POA: Diagnosis not present

## 2023-10-18 MED ORDER — METHYLPREDNISOLONE ACETATE 40 MG/ML IJ SUSP
40.0000 mg | Freq: Once | INTRAMUSCULAR | Status: AC
  Administered 2023-10-18: 40 mg

## 2023-10-18 NOTE — Patient Instructions (Signed)

## 2023-10-18 NOTE — Progress Notes (Signed)
 Pain scale---3 No Allergies to Contrast  No Blood Thinners

## 2023-10-29 ENCOUNTER — Encounter: Payer: Self-pay | Admitting: Physical Medicine and Rehabilitation

## 2023-10-29 NOTE — Procedures (Signed)
 Lumbar Epidural Steroid Injection - Interlaminar Approach with Fluoroscopic Guidance  Patient: Jerry Arroyo      Date of Birth: 03-17-1966 MRN: [id number] PCP: Margaree Mackintosh, MD      Visit Date: 10/18/2023   Universal Protocol:     Consent Given By: the patient  Position: PRONE  Additional Comments: Vital signs were monitored before and after the procedure. Patient was prepped and draped in the usual sterile fashion. The correct patient, procedure, and site was verified.   Injection Procedure Details:   Procedure diagnoses: Lumbar radiculopathy [M54.16]   Meds Administered:  Meds ordered this encounter  Medications   methylPREDNISolone acetate (DEPO-MEDROL) injection 40 mg     Laterality: Right  Location/Site:  L4-5  Needle: 3.5 in., 20 ga. Tuohy  Needle Placement: Paramedian epidural  Findings:   -Comments: Excellent flow of contrast into the epidural space.  Procedure Details: Using a paramedian approach from the side mentioned above, the region overlying the inferior lamina was localized under fluoroscopic visualization and the soft tissues overlying this structure were infiltrated with 4 ml. of 1% Lidocaine without Epinephrine. The Tuohy needle was inserted into the epidural space using a paramedian approach.   The epidural space was localized using loss of resistance along with counter oblique bi-planar fluoroscopic views.  After negative aspirate for air, blood, and CSF, a 2 ml. volume of Isovue-250 was injected into the epidural space and the flow of contrast was observed. Radiographs were obtained for documentation purposes.    The injectate was administered into the level noted above.   Additional Comments:  The patient tolerated the procedure well Dressing: 2 x 2 sterile gauze and Band-Aid    Post-procedure details: Patient was observed during the procedure. Post-procedure instructions were reviewed.  Patient left the clinic in stable  condition.

## 2023-10-29 NOTE — Progress Notes (Signed)
 Jerry Arroyo - 58 y.o. male MRN [id number]  Date of birth: August 17, 1966  Office Visit Note: Visit Date: 10/18/2023 PCP: Margaree Mackintosh, MD Referred by: Margaree Mackintosh, MD  Subjective: Chief Complaint  Patient presents with   Lower Back - Pain   HPI:  Jerry Arroyo is a 58 y.o. male who comes in today at the request of Dr. Annell Greening for planned Right L4-5 Lumbar Interlaminar epidural steroid injection with fluoroscopic guidance.  The patient has failed conservative care including home exercise, medications, time and activity modification.  This injection will be diagnostic and hopefully therapeutic.  Please see requesting physician notes for further details and justification.   ROS Otherwise per HPI.  Assessment & Plan: Visit Diagnoses:    ICD-10-CM   1. Lumbar radiculopathy  M54.16 XR C-ARM NO REPORT    Epidural Steroid injection    methylPREDNISolone acetate (DEPO-MEDROL) injection 40 mg      Plan: No additional findings.   Meds & Orders:  Meds ordered this encounter  Medications   methylPREDNISolone acetate (DEPO-MEDROL) injection 40 mg    Orders Placed This Encounter  Procedures   XR C-ARM NO REPORT   Epidural Steroid injection    Follow-up: Return in about 2 weeks (around 11/01/2023) for Annell Greening, MD.   Procedures: No procedures performed  Lumbar Epidural Steroid Injection - Interlaminar Approach with Fluoroscopic Guidance  Patient: Jerry Arroyo      Date of Birth: Jun 17, 1966 MRN: [id number] PCP: Margaree Mackintosh, MD      Visit Date: 10/18/2023   Universal Protocol:     Consent Given By: the patient  Position: PRONE  Additional Comments: Vital signs were monitored before and after the procedure. Patient was prepped and draped in the usual sterile fashion. The correct patient, procedure, and site was verified.   Injection Procedure Details:   Procedure diagnoses: Lumbar radiculopathy [M54.16]   Meds Administered:  Meds ordered this  encounter  Medications   methylPREDNISolone acetate (DEPO-MEDROL) injection 40 mg     Laterality: Right  Location/Site:  L4-5  Needle: 3.5 in., 20 ga. Tuohy  Needle Placement: Paramedian epidural  Findings:   -Comments: Excellent flow of contrast into the epidural space.  Procedure Details: Using a paramedian approach from the side mentioned above, the region overlying the inferior lamina was localized under fluoroscopic visualization and the soft tissues overlying this structure were infiltrated with 4 ml. of 1% Lidocaine without Epinephrine. The Tuohy needle was inserted into the epidural space using a paramedian approach.   The epidural space was localized using loss of resistance along with counter oblique bi-planar fluoroscopic views.  After negative aspirate for air, blood, and CSF, a 2 ml. volume of Isovue-250 was injected into the epidural space and the flow of contrast was observed. Radiographs were obtained for documentation purposes.    The injectate was administered into the level noted above.   Additional Comments:  The patient tolerated the procedure well Dressing: 2 x 2 sterile gauze and Band-Aid    Post-procedure details: Patient was observed during the procedure. Post-procedure instructions were reviewed.  Patient left the clinic in stable condition.   Clinical History: MRI LUMBAR SPINE WITHOUT CONTRAST   TECHNIQUE: Multiplanar, multisequence MR imaging of the lumbar spine was performed. No intravenous contrast was administered.   COMPARISON:  Lumbar spine radiographs 06/26/2023   FINDINGS: Segmentation:  Standard.   Alignment:  Normal.   Vertebrae: No fracture or suspicious marrow lesion. Moderate right greater than  left facet edema at L4-5.   Conus medullaris and cauda equina: Conus extends to the lower T12 level. Conus and cauda equina appear normal.   Paraspinal and other soft tissues: Unremarkable.   Disc levels:   T12-L1 and L1-2:  Negative.   L2-3: Disc desiccation. Mild disc bulging, a small left foraminal disc protrusion, and mild facet hypertrophy without significant stenosis.   L3-4: Disc desiccation. Disc bulging and mild facet hypertrophy result in mild bilateral lateral recess stenosis and mild bilateral neural foraminal stenosis without spinal stenosis.   L4-5: Disc desiccation. Circumferential disc bulging, a superimposed central disc protrusion, and severe facet arthrosis result in mild spinal stenosis, moderate right greater than left lateral recess stenosis, and mild-to-moderate bilateral neural foraminal stenosis. The L5 nerve roots may be affected in the lateral recesses.   L5-S1: Normal disc.  Mild facet hypertrophy without stenosis.   IMPRESSION: 1. 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 2. Mild spinal stenosis, moderate lateral recess stenosis, and mild-to-moderate neural foraminal stenosis at L4-5. 3. Mild lateral recess and neural foraminal stenosis at L3-4.     Electronically Signed   By: Sebastian Ache M.D.   On: 10/05/2023 15:11     Objective:  VS:  HT:    WT:   BMI:     BP:137/73  HR:79bpm  TEMP: ( )  RESP:  Physical Exam Vitals and nursing note reviewed.  Constitutional:      General: He is not in acute distress.    Appearance: Normal appearance. He is not ill-appearing.  HENT:     Head: Normocephalic and atraumatic.     Right Ear: External ear normal.     Left Ear: External ear normal.     Nose: No congestion.  Eyes:     Extraocular Movements: Extraocular movements intact.  Cardiovascular:     Rate and Rhythm: Normal rate.     Pulses: Normal pulses.  Pulmonary:     Effort: Pulmonary effort is normal. No respiratory distress.  Abdominal:     General: There is no distension.     Palpations: Abdomen is soft.  Musculoskeletal:        General: No tenderness or signs of injury.     Cervical back: Neck supple.     Right lower leg: No  edema.     Left lower leg: No edema.     Comments: Patient has good distal strength without clonus.  Skin:    Findings: No erythema or rash.  Neurological:     General: No focal deficit present.     Mental Status: He is alert and oriented to person, place, and time.     Sensory: No sensory deficit.     Motor: No weakness or abnormal muscle tone.     Coordination: Coordination normal.  Psychiatric:        Mood and Affect: Mood normal.        Behavior: Behavior normal.      Imaging: No results found.

## 2023-11-02 ENCOUNTER — Ambulatory Visit: Payer: 59 | Admitting: Orthopaedic Surgery

## 2023-11-08 ENCOUNTER — Ambulatory Visit: Payer: 59 | Admitting: Internal Medicine

## 2023-11-23 ENCOUNTER — Ambulatory Visit: Payer: 59 | Admitting: Internal Medicine

## 2024-01-07 ENCOUNTER — Encounter: Payer: Self-pay | Admitting: Internal Medicine

## 2024-01-07 ENCOUNTER — Ambulatory Visit (INDEPENDENT_AMBULATORY_CARE_PROVIDER_SITE_OTHER): Admitting: Internal Medicine

## 2024-01-07 VITALS — BP 120/70 | HR 70 | Ht 76.0 in | Wt 238.0 lb

## 2024-01-07 DIAGNOSIS — E785 Hyperlipidemia, unspecified: Secondary | ICD-10-CM | POA: Diagnosis not present

## 2024-01-07 DIAGNOSIS — Z7984 Long term (current) use of oral hypoglycemic drugs: Secondary | ICD-10-CM

## 2024-01-07 DIAGNOSIS — E1142 Type 2 diabetes mellitus with diabetic polyneuropathy: Secondary | ICD-10-CM

## 2024-01-07 DIAGNOSIS — Z7985 Long-term (current) use of injectable non-insulin antidiabetic drugs: Secondary | ICD-10-CM | POA: Diagnosis not present

## 2024-01-07 LAB — POCT GLYCOSYLATED HEMOGLOBIN (HGB A1C): Hemoglobin A1C: 7.2 % — AB (ref 4.0–5.6)

## 2024-01-07 MED ORDER — SEMAGLUTIDE(0.25 OR 0.5MG/DOS) 2 MG/3ML ~~LOC~~ SOPN: 0.5000 mg | PEN_INJECTOR | SUBCUTANEOUS | 3 refills | Status: DC

## 2024-01-07 MED ORDER — ACCU-CHEK GUIDE W/DEVICE KIT
PACK | 0 refills | Status: AC
Start: 2024-01-07 — End: ?
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
## 2024-01-07 NOTE — Patient Instructions (Addendum)
 Please continue: - Metformin  1000 2x a day with meals. - Jardiance  10 mg before b'fast  Please restart: - Ozempic  0.5 mg weekly  Check sugars 1x a day, rotating check times.  Please return in 4 months with your sugar log.

## 2024-01-08 ENCOUNTER — Encounter: Payer: Self-pay | Admitting: Internal Medicine

## 2024-01-08 LAB — MICROALBUMIN / CREATININE URINE RATIO
Creatinine, Urine: 164 mg/dL (ref 20–320)
Microalb Creat Ratio: 7 mg/g{creat} (ref <30)
Microalb, Ur: 1.2 mg/dL

## 2024-03-14 ENCOUNTER — Ambulatory Visit: Attending: Cardiovascular Disease | Admitting: Cardiovascular Disease

## 2024-03-14 ENCOUNTER — Encounter: Payer: Self-pay | Admitting: Cardiovascular Disease

## 2024-03-14 VITALS — BP 100/62 | HR 70 | Ht 74.0 in | Wt 241.0 lb

## 2024-03-14 DIAGNOSIS — E119 Type 2 diabetes mellitus without complications: Secondary | ICD-10-CM

## 2024-03-14 DIAGNOSIS — E782 Mixed hyperlipidemia: Secondary | ICD-10-CM

## 2024-03-14 DIAGNOSIS — I2510 Atherosclerotic heart disease of native coronary artery without angina pectoris: Secondary | ICD-10-CM

## 2024-03-14 MED ORDER — EZETIMIBE 10 MG PO TABS: 10.0000 mg | ORAL_TABLET | Freq: Every day | ORAL | 3 refills | Status: AC

## 2024-03-14 MED ORDER — ATORVASTATIN CALCIUM 80 MG PO TABS: 80.0000 mg | ORAL_TABLET | Freq: Every day | ORAL | 3 refills | Status: AC

## 2024-03-14 NOTE — Patient Instructions (Signed)

## 2024-03-14 NOTE — Progress Notes (Signed)
 Cardiology Office Note  Date:  03/14/2024   ID:  Jerry Arroyo, DOB 03-21-1966, MRN [id number]  PCP:  Perri Ronal PARAS, MD   Chief Complaint  Patient presents with   12 month follow up     Doing well.     HPI:  Mr. Jerry Arroyo is a 58 year old gentleman with past medical history of Diabetes Hyperlipidemia Mnire's disease Neuropathy Who presents for follow-up of his chest pain/angina  Seen by myself in 2/24 Last seen by one of our providers March 2024  In follow-up today reports feeling well, Denies chest pain or shortness of breath concerning for angina  Active at baseline, takes care of yard work at [employer] among other places Each location takes 1 day of work to manage Lots of mowing, weed eating  Prior studies were reviewed in detail Cardiac catheterization February 2024 Mild to moderate nonobstructive coronary disease Ejection fraction 55%  Lab work reviewed A1c 7.2 Total cholesterol 158 LDL 69  EKG personally reviewed by myself on todays visit EKG Interpretation Date/Time:  Friday March 14 2024 09:39:17 EDT Ventricular Rate:  70 PR Interval:  164 QRS Duration:  100 QT Interval:  404 QTC Calculation: 436 R Axis:   -31  Text Interpretation: Normal sinus rhythm Left axis deviation Nonspecific ST abnormality No previous ECGs available Confirmed by Perla Lye [phone]) on 03/14/2024 10:23:20 AM    PMH:   has a past medical history of Allergy (09/20/2021), Anxiety, Asthma, Diabetes mellitus without complication (HCC), History of kidney stones, and Hyperlipidemia.  PSH:    Past Surgical History:  Procedure Laterality Date   ADENOIDECTOMY     FLEXIBLE SIGMOIDOSCOPY N/A 11/03/2021   Procedure: FLEXIBLE SIGMOIDOSCOPY;  Surgeon: Leigh Elspeth SQUIBB, MD;  Location: [facility];  Service: Gastroenterology;  Laterality: N/A;   HEMOSTASIS CLIP PLACEMENT  11/03/2021   Procedure: HEMOSTASIS CLIP PLACEMENT;  Surgeon: Leigh Elspeth SQUIBB, MD;  Location: [facility];  Service: Gastroenterology;;   HERNIA REPAIR     left and right   LAMINECTOMY AND MICRODISCECTOMY CERVICAL SPINE  2018   C6-7   LEFT HEART CATH AND CORONARY ANGIOGRAPHY Left 10/11/2022   Procedure: LEFT HEART CATH AND CORONARY ANGIOGRAPHY;  Surgeon: Mady Bruckner, MD;  Location: [facility] INVAS[facility] CV LAB;  Service: Cardiovascular;  Laterality: Left;   POLYPECTOMY  11/03/2021   Procedure: POLYPECTOMY;  Surgeon: Leigh Elspeth SQUIBB, MD;  Location: [facility];  Service: Gastroenterology;;   ROBLEY LIFTING INJECTION  11/03/2021   Procedure: SUBMUCOSAL LIFTING INJECTION;  Surgeon: Leigh Elspeth SQUIBB, MD;  Location: [facility];  Service: Gastroenterology;;   TONSILLECTOMY     TYMPANOSTOMY TUBE PLACEMENT      Current Outpatient Medications  Medication Sig Dispense Refill   Accu-Chek Softclix Lancets lancets Use as instructed 1x a day 100 each 12   albuterol  (VENTOLIN  HFA) 108 (90 Base) MCG/ACT inhaler INHALE 2 PUFFS INTO THE LUNGS EVERY 6 HOURS AS NEEDED FOR WHEEZING OR SHORTNESS OF BREATH 6.7 g 2   ALPRAZolam  (XANAX ) 0.5 MG tablet Take 1 tablet (0.5 mg total) by mouth 2 (two) times daily as needed for anxiety. 60 tablet 2   atorvastatin  (LIPITOR) 80 MG tablet Take 1 tablet (80 mg total) by mouth daily. 90 tablet 3   Blood Glucose Monitoring Suppl (ACCU-CHEK GUIDE) w/Device KIT Use as advised 1 kit 0   EPINEPHrine  (EPIPEN  2-PAK) 0.3 mg/0.3 mL IJ SOAJ injection Inject 0.3 mLs (0.3 mg total) into the muscle as needed for anaphylaxis. 1 each 3   ezetimibe  (  ZETIA ) 10 MG tablet Take 1 tablet (10 mg total) by mouth daily. 90 tablet 3   fexofenadine (ALLEGRA) 180 MG tablet Take 180 mg by mouth daily.     gabapentin  (NEURONTIN ) 300 MG capsule TAKE 1 CAPSULE(300 MG) BY MOUTH AT BEDTIME 90 capsule 3   JARDIANCE  10 MG TABS tablet TAKE 1 TABLET(10 MG) BY MOUTH DAILY BEFORE BREAKFAST 90 tablet 3   lamoTRIgine (LAMICTAL) 150 MG tablet Take 150 mg by mouth daily.     metFORMIN  (GLUCOPHAGE ) 1000  MG tablet TAKE 1 TABLET BY MOUTH TWICE  DAILY WITH MEALS 180 tablet 3   Multiple Vitamin (MULTIVITAMIN) tablet Take 1 tablet by mouth daily.     nitroGLYCERIN  (NITROSTAT ) 0.4 MG SL tablet Place 1 tablet (0.4 mg total) under the tongue every 5 (five) minutes as needed for chest pain. 25 tablet 3   nystatin  cream (MYCOSTATIN ) Apply 1 Application topically 2 (two) times daily. 30 g 0   Semaglutide ,0.25 or 0.5MG /DOS, 2 MG/3ML SOPN Inject 0.5 mg into the skin once a week. 9 mL 3   sertraline (ZOLOFT) 100 MG tablet Take 200 mg by mouth daily.     No current facility-administered medications for this visit.    Allergies:   Bee venom   Social History:  The patient  reports that he has never smoked. His smokeless tobacco use includes snuff. He reports that he does not drink alcohol and does not use drugs.   Family History:   family history includes Allergic rhinitis in his mother; Colon cancer in his paternal grandmother; Colon polyps in his father and paternal grandmother; Healthy in his father and mother; Heart disease (age of onset: 67) in his father; Heart failure in his mother.    Review of Systems: Review of Systems  Constitutional: Negative.   HENT: Negative.    Respiratory: Negative.    Cardiovascular: Negative.   Gastrointestinal: Negative.   Musculoskeletal: Negative.   Neurological: Negative.   Psychiatric/Behavioral: Negative.    All other systems reviewed and are negative.    PHYSICAL EXAM: VS:  BP 100/62 (BP Location: Left Arm, Patient Position: Sitting, Cuff Size: Normal)   Pulse 70   Ht 6' 2 (1.88 m)   Wt 241 lb (109.3 kg)   SpO2 96%   BMI 30.94 kg/m  , BMI Body mass index is 30.94 kg/m. Constitutional:  oriented to person, place, and time. No distress.  HENT:  Head: Grossly normal Eyes:  no discharge. No scleral icterus.  Neck: No JVD, no carotid bruits  Cardiovascular: Regular rate and rhythm, no murmurs appreciated Pulmonary/Chest: Clear to auscultation  bilaterally, no wheezes or rales Abdominal: Soft.  no distension.  no tenderness.  Musculoskeletal: Normal range of motion Neurological:  normal muscle tone. Coordination normal. No atrophy Skin: Skin warm and dry Psychiatric: normal affect, pleasant  Recent Labs: 07/16/2023: ALT 32; BUN 9; Creatinine, Ser 0.84; Potassium 3.9; Sodium 141    Lipid Panel Lab Results  Component Value Date   CHOL 158 07/16/2023   HDL 42.60 07/16/2023   LDLCALC 69 07/16/2023   TRIG 235.0 (H) 07/16/2023     Wt Readings from Last 3 Encounters:  03/14/24 241 lb (109.3 kg)  01/07/24 238 lb (108 kg)  07/16/23 239 lb 3.2 oz (108.5 kg)     ASSESSMENT AND PLAN:  Problem List Items Addressed This Visit       Cardiology Problems   Hyperlipidemia   Other Visit Diagnoses       Coronary artery disease  involving native coronary artery of native heart without angina pectoris    -  Primary   Relevant Orders   EKG 12-Lead (Completed)     Hyperlipidemia, mixed         Type 2 diabetes mellitus without complication, without long-term current use of insulin (HCC)          Coronary disease with stable angina Cardiac catheterization performed 2024 with nonobstructive disease Stressed importance of aggressive diabetes and cholesterol management  Hyperlipidemia Recommend he continue Lipitor 80 daily with Zetia   Diabetes type 2 A1c low 7 range, stressed importance of low carbohydrates, continue exercise program  Hypotension Blood pressure running low, reports he stopped his metoprolol  Systolic pressure 100 today, recommend he hydrate Low pressure could be exacerbated by any weight loss on Ozempic  Recommend he closely monitor numbers at home and stay hydrated  Signed, Velinda Lunger, M.D., Ph.D. [facility] [place], [place] [phone]

## 2024-04-13 ENCOUNTER — Encounter: Payer: Self-pay | Admitting: Internal Medicine

## 2024-04-14 MED ORDER — METFORMIN HCL 1000 MG PO TABS: 1000.0000 mg | ORAL_TABLET | Freq: Two times a day (BID) | ORAL | 3 refills | Status: AC

## 2024-05-16 ENCOUNTER — Ambulatory Visit: Admitting: Internal Medicine

## 2024-05-16 ENCOUNTER — Encounter: Payer: Self-pay | Admitting: Internal Medicine

## 2024-05-16 VITALS — BP 120/60 | HR 72 | Ht 74.0 in | Wt 241.2 lb

## 2024-05-16 DIAGNOSIS — E785 Hyperlipidemia, unspecified: Secondary | ICD-10-CM

## 2024-05-16 DIAGNOSIS — E1142 Type 2 diabetes mellitus with diabetic polyneuropathy: Secondary | ICD-10-CM

## 2024-05-16 DIAGNOSIS — Z7984 Long term (current) use of oral hypoglycemic drugs: Secondary | ICD-10-CM

## 2024-05-16 DIAGNOSIS — E66811 Obesity, class 1: Secondary | ICD-10-CM

## 2024-05-16 LAB — POCT GLYCOSYLATED HEMOGLOBIN (HGB A1C): Hemoglobin A1C: 7.6 % — AB (ref 4.0–5.6)

## 2024-05-16 MED ORDER — OZEMPIC (1 MG/DOSE) 4 MG/3ML ~~LOC~~ SOPN: 1.0000 mg | PEN_INJECTOR | SUBCUTANEOUS | 3 refills | Status: AC
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
## 2024-05-16 NOTE — Patient Instructions (Addendum)
 Please try to change - Metformin  2000 mg with dinner  Continue: - Jardiance  10 mg before b'fast  Increase: - Ozempic  1 mg weekly  STOP ANY SWEET DRINKS!  Please return in 3 months with your sugar log.

## 2024-07-07 ENCOUNTER — Encounter: Payer: Self-pay | Admitting: Radiology

## 2024-07-09 ENCOUNTER — Encounter: Payer: Self-pay | Admitting: Internal Medicine

## 2024-07-23 ENCOUNTER — Telehealth: Payer: Self-pay | Admitting: Physical Medicine and Rehabilitation

## 2024-07-23 ENCOUNTER — Other Ambulatory Visit (INDEPENDENT_AMBULATORY_CARE_PROVIDER_SITE_OTHER)

## 2024-07-23 ENCOUNTER — Ambulatory Visit (INDEPENDENT_AMBULATORY_CARE_PROVIDER_SITE_OTHER): Admitting: Orthopedic Surgery

## 2024-07-23 VITALS — BP 105/73 | HR 90 | Ht 74.0 in | Wt 242.0 lb

## 2024-07-23 DIAGNOSIS — M545 Low back pain, unspecified: Secondary | ICD-10-CM

## 2024-07-23 NOTE — Progress Notes (Signed)
 Orthopedic Spine Surgery Office Note  Assessment: Patient is a 58 y.o. male with chronic, progressive low back pain.  Has facet arthropathy at L4/5   Plan: -Explained that initially conservative treatment is tried as a significant number of patients may experience relief with these treatment modalities. Discussed that the conservative treatments include:  -activity modification  -physical therapy  -over the counter pain medications  -medrol  dosepak  -lumbar steroid injections -Patient has tried Tylenol , ibuprofen, PT, lumbar ESI  -Recommended L4/5 facet injections with possible RFA if he does well with injections -Discussed some of the limitations of discectomy and helping back pain and why.  Talked about fusion but some of the issues with fusion for back pain -Would need to be nicotine free prior to any elective spine surgery -Patient should return to office in 5-6 weeks, x-rays at next visit: None   Patient expressed understanding of the plan and all questions were answered to the patient's satisfaction.   ___________________________________________________________________________   History:  Patient is a 58 y.o. male who presents today for lumbar spine.  Patient has had several years of low back pain.  It has gotten progressively worse with time.  He feels that around the belt line.  No pain radiating into either lower extremity.  He has not noticed any significant improvement with the conservative treatments tried so far.  There is no trauma or injury that preceded the onset of the pain.  He rates the pain as an 8 out of 10 at its worst.  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  It is not as bad if he is sitting at his other job.  No bowel or bladder incontinence.  No saddle anesthesia.  Treatments tried: Tylenol , ibuprofen, PT, lumbar ESI  Review of systems: Denies fevers and chills, night sweats, unexplained weight loss, history of cancer.  Has  had pain that wakes him at night.  Past medical history: Depression/anxiety HLD DM (last A1c was 7.6 on 05/16/2024) Chronic pain  Allergies: NKDA  Past surgical history:  Hernia repair Adenoidectomy Tonsillectomy Polypectomy C6/7 laminectomy  Social history: Reports use of nicotine product (smoking, vaping, patches, smokeless) Alcohol use: denies Denies recreational drug use   Physical Exam:  BMI of 31.1  General: no acute distress, appears stated age Neurologic: alert, answering questions appropriately, following commands Respiratory: unlabored breathing on room air, symmetric chest rise Psychiatric: appropriate affect, normal cadence to speech   MSK (spine):  -Strength exam      Left  Right EHL    5/5  5/5 TA    5/5  5/5 GSC    5/5  5/5 Knee extension  5/5  5/5 Hip flexion   5/5  5/5  -Sensory exam    Sensation intact to light touch in L3-S1 nerve distributions of bilateral lower extremities  -Achilles DTR: 2/4 on the left, 2/4 on the right -Patellar tendon DTR: 2/4 on the left, 2/4 on the right  -Straight leg raise: negative bilaterally -Clonus: no beats bilaterally  -Left hip exam: no pain through range of motion -Right hip exam: no pain through range of motion   Imaging: XRs of the lumbar spine from 07/23/2024 were independently reviewed and interpreted, showing no significant degenerative changes.  No evidence of instability on flexion/extension views.  No fracture or dislocation seen.  Lordotic alignment.  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  Facet arthropathy at L4/5.   Patient name: Jerry Arroyo Patient MRN: [id number] Date of visit: 07/23/24

## 2024-07-23 NOTE — Telephone Encounter (Signed)
 Pt was seen by Dr. Lorrain lovely is referring him to Dr. Eldonna. He needs to make an appt with Dr. Eldonna. Call back number is [phone]

## 2024-08-13 ENCOUNTER — Ambulatory Visit: Admitting: Physical Medicine and Rehabilitation

## 2024-08-13 ENCOUNTER — Encounter: Payer: Self-pay | Admitting: Physical Medicine and Rehabilitation

## 2024-08-13 DIAGNOSIS — M545 Low back pain, unspecified: Secondary | ICD-10-CM | POA: Diagnosis not present

## 2024-08-13 DIAGNOSIS — G8929 Other chronic pain: Secondary | ICD-10-CM

## 2024-08-13 DIAGNOSIS — M47816 Spondylosis without myelopathy or radiculopathy, lumbar region: Secondary | ICD-10-CM | POA: Diagnosis not present

## 2024-08-13 MED ORDER — MELOXICAM 15 MG PO TABS: 15.0000 mg | ORAL_TABLET | Freq: Every day | ORAL | 0 refills | Status: AC

## 2024-08-13 MED ORDER — METHOCARBAMOL 500 MG PO TABS: 500.0000 mg | ORAL_TABLET | Freq: Three times a day (TID) | ORAL | 0 refills | Status: AC

## 2024-08-13 NOTE — Progress Notes (Unsigned)
 Pain Scale   Average Pain 5 Patient advising he has chronic lower back pain radiating to bilateral legs more so on the right side        +Driver, -BT, -Dye Allergies.

## 2024-08-13 NOTE — Progress Notes (Unsigned)
 Jerry Arroyo - 58 y.o. male MRN [id number]  Date of birth: [birth year]  Office Visit Note: Visit Date: 08/13/2024 PCP: Perri Ronal PARAS, MD Referred by: Perri Ronal PARAS, MD  Subjective: Chief Complaint  Patient presents with   Lower Back - Pain   HPI: Jerry Arroyo is a 58 y.o. male who comes in today per the request of Dr. Ozell Ada for evaluation of chronic, worsening and severe bilateral lower back pain. He is previous patient of Dr. Oneil Herald. Pain ongoing for several years. His pain worsens with movement and activity. He describes pain as stabbing and throbbing sensation, currently rates as 5 out of 10. Some relief of pain with home exercise regimen, rest and use of medications. History of formal physical therapy with minimal relief of pain. Recent lumbar MRI imaging shows severe L4-L5 facet arthrosis with edema. No high grade spinal canal stenosis. No history of lumbar surgery/injections. Patient owns lawn care business. Patient denies focal weakness, numbness and tingling. No recent trauma or falls.      Review of Systems  Musculoskeletal:  Positive for back pain.  Neurological:  Negative for tingling, sensory change, focal weakness and weakness.  All other systems reviewed and are negative.  Otherwise per HPI.  Assessment & Plan: Visit Diagnoses:    ICD-10-CM   1. Chronic bilateral low back pain without sciatica  M54.50    G89.29     2. Spondylosis without myelopathy or radiculopathy, lumbar region  M47.816     3. Facet arthropathy, lumbar  M47.816        Plan: Findings:  Chronic, worsening and severe bilateral lower back pain. No radicular pain down the legs.  Patient continues to have severe pain despite good conservative therapy such as formal physical therapy, home exercise regimen, rest and use of medications.  Patient's clinical presentation and exam are consistent with facet mediated pain.  He does have pain with lumbar extension on exam today.  There is  severe facet arthropathy at the level of L4-L5 on recent lumbar MRI imaging.  We discussed treatment plan in detail today.  Next of is to perform diagnostic bilateral L4-L5 medial branch blocks under fluoroscopic guidance. If good relief of pain with diagnostic medial blocks we discussed longer sustained pain relief with radiofrequency ablation procedure. Patient has no questions at this time. We discussed medication management, I prescribed short course of meloxicam and robaxin while we are waiting on approval for injection. His exam today is non focal, good strength noted to bilateral lower extremities.     Meds & Orders: No orders of the defined types were placed in this encounter.  No orders of the defined types were placed in this encounter.   Follow-up: Return for Bilateral L4-L5 medial branch blocks.   Procedures: No procedures performed      Clinical History: MRI LUMBAR SPINE WITHOUT CONTRAST   TECHNIQUE: Multiplanar, multisequence MR imaging of the lumbar spine was performed. No intravenous contrast was administered.   COMPARISON:  Lumbar spine radiographs 06/26/2023   FINDINGS: Segmentation:  Standard.   Alignment:  Normal.   Vertebrae: No fracture or suspicious marrow lesion. Moderate right greater than left facet edema at L4-5.   Conus medullaris and cauda equina: Conus extends to the lower T12 level. Conus and cauda equina appear normal.   Paraspinal and other soft tissues: Unremarkable.   Disc levels:   T12-L1 and L1-2: Negative.   L2-3: Disc desiccation. Mild disc bulging, a small left foraminal disc  protrusion, and mild facet hypertrophy without significant stenosis.   L3-4: Disc desiccation. Disc bulging and mild facet hypertrophy result in mild bilateral lateral recess stenosis and mild bilateral neural foraminal stenosis without spinal stenosis.   L4-5: Disc desiccation. Circumferential disc bulging, a superimposed central disc protrusion, and severe  facet arthrosis result in mild spinal stenosis, moderate right greater than left lateral recess stenosis, and mild-to-moderate bilateral neural foraminal stenosis. The L5 nerve roots may be affected in the lateral recesses.   L5-S1: Normal disc.  Mild facet hypertrophy without stenosis.   IMPRESSION: 1. 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 2. Mild spinal stenosis, moderate lateral recess stenosis, and mild-to-moderate neural foraminal stenosis at L4-5. 3. Mild lateral recess and neural foraminal stenosis at L3-4.     Electronically Signed   By: Dasie Hamburg M.D.   On: 10/05/2023 15:11   He reports that he has never smoked. His smokeless tobacco use includes snuff.  Recent Labs    01/07/24 0854 05/16/24 0846  HGBA1C 7.2* 7.6*    Objective:  VS:  HT:    WT:   BMI:     BP:   HR: bpm  TEMP: ( )  RESP:  Physical Exam Vitals and nursing note reviewed.  HENT:     Head: Normocephalic and atraumatic.     Right Ear: External ear normal.     Left Ear: External ear normal.     Nose: Nose normal.     Mouth/Throat:     Mouth: Mucous membranes are moist.  Eyes:     Extraocular Movements: Extraocular movements intact.  Cardiovascular:     Rate and Rhythm: Normal rate.     Pulses: Normal pulses.  Pulmonary:     Effort: Pulmonary effort is normal.  Abdominal:     General: Abdomen is flat. There is no distension.  Musculoskeletal:        General: Tenderness present.     Cervical back: Normal range of motion.     Comments: Patient rises from seated position to standing without difficulty. Pain noted with facet loading and lumbar extension. 5/5 strength noted with bilateral hip flexion, knee flexion/extension, ankle dorsiflexion/plantarflexion and EHL. No clonus noted bilaterally. No pain upon palpation of greater trochanters. No pain with internal/external rotation of bilateral hips. Sensation intact bilaterally. Negative slump test bilaterally.  Ambulates without aid, gait steady.     Skin:    General: Skin is warm and dry.     Capillary Refill: Capillary refill takes less than 2 seconds.  Neurological:     General: No focal deficit present.     Mental Status: He is alert and oriented to person, place, and time.  Psychiatric:        Mood and Affect: Mood normal.        Behavior: Behavior normal.     Ortho Exam  Imaging: No results found.  Past Medical/Family/Surgical/Social History: Medications & Allergies reviewed per EMR, new medications updated. Patient Active Problem List   Diagnosis Date Noted   Unstable angina (HCC) 10/11/2022   Chest pain of uncertain etiology 10/08/2022   History of colonic polyps    Benign neoplasm of rectosigmoid junction    Type 2 diabetes mellitus with peripheral neuropathy (HCC) 04/19/2021   Generalized osteoarthritis 04/19/2021   Degenerative disc disease, lumbar 04/19/2021   Heel spur, left 04/19/2021   History of anxiety 04/19/2021   Peripheral neuropathy 04/19/2021   Hymenoptera allergy 04/21/2020  Cervical disc disease 04/02/2017   Hyperlipidemia 12/15/2014   History of kidney stones 07/22/2013   Past Medical History:  Diagnosis Date   Allergy 09/20/2021   seasonal and environmental and Bee venom   Anxiety    Asthma    Diabetes mellitus without complication (HCC)    History of kidney stones    Hyperlipidemia    Family History  Problem Relation Age of Onset   Heart failure Mother    Healthy Mother    Allergic rhinitis Mother    Heart disease Father 29       CABG   Colon polyps Father    Healthy Father    Colon polyps Paternal Grandmother    Colon cancer Paternal Grandmother    Esophageal cancer Neg Hx    Stomach cancer Neg Hx    Rectal cancer Neg Hx    Past Surgical History:  Procedure Laterality Date   ADENOIDECTOMY     FLEXIBLE SIGMOIDOSCOPY N/A 11/03/2021   Procedure: FLEXIBLE SIGMOIDOSCOPY;  Surgeon: Leigh Elspeth SQUIBB, MD;  Location: [facility];   Service: Gastroenterology;  Laterality: N/A;   HEMOSTASIS CLIP PLACEMENT  11/03/2021   Procedure: HEMOSTASIS CLIP PLACEMENT;  Surgeon: Leigh Elspeth SQUIBB, MD;  Location: [facility];  Service: Gastroenterology;;   HERNIA REPAIR     left and right   LAMINECTOMY AND MICRODISCECTOMY CERVICAL SPINE  2018   C6-7   LEFT HEART CATH AND CORONARY ANGIOGRAPHY Left 10/11/2022   Procedure: LEFT HEART CATH AND CORONARY ANGIOGRAPHY;  Surgeon: Mady Bruckner, MD;  Location: [facility];  Service: Cardiovascular;  Laterality: Left;   POLYPECTOMY  11/03/2021   Procedure: POLYPECTOMY;  Surgeon: Leigh Elspeth SQUIBB, MD;  Location: [facility];  Service: Gastroenterology;;   ROBLEY LIFTING INJECTION  11/03/2021   Procedure: SUBMUCOSAL LIFTING INJECTION;  Surgeon: Leigh Elspeth SQUIBB, MD;  Location: [facility];  Service: Gastroenterology;;   TONSILLECTOMY     TYMPANOSTOMY TUBE PLACEMENT     Social History   Occupational History   Not on file  Tobacco Use   Smoking status: Never   Smokeless tobacco: Current    Types: Snuff  Vaping Use   Vaping status: Never Used  Substance and Sexual Activity   Alcohol use: Never   Drug use: Never   Sexual activity: Not on file

## 2024-09-09 ENCOUNTER — Ambulatory Visit (INDEPENDENT_AMBULATORY_CARE_PROVIDER_SITE_OTHER): Admitting: Physical Medicine and Rehabilitation

## 2024-09-09 ENCOUNTER — Other Ambulatory Visit: Payer: Self-pay

## 2024-09-09 VITALS — BP 123/80 | HR 71

## 2024-09-09 DIAGNOSIS — M47816 Spondylosis without myelopathy or radiculopathy, lumbar region: Secondary | ICD-10-CM

## 2024-09-09 MED ORDER — BUPIVACAINE HCL 0.5 % IJ SOLN
3.0000 mL | Freq: Once | INTRAMUSCULAR | Status: AC
  Administered 2024-09-09: 3 mL

## 2024-09-09 NOTE — Procedures (Signed)
 Lumbar Diagnostic Facet Joint Nerve Block with Fluoroscopic Guidance   Patient: Jerry Arroyo      Date of Birth: 1966/05/22 MRN: [id number] PCP: Perri Ronal PARAS, MD      Visit Date: 09/09/2024   Universal Protocol:    Date/Time: 1/6/20264:23 PM  Consent Given By: the patient  Position: PRONE  Additional Comments: Vital signs were monitored before and after the procedure. Patient was prepped and draped in the usual sterile fashion. The correct patient, procedure, and site was verified.   Injection Procedure Details:   Procedure diagnoses:  1. Spondylosis without myelopathy or radiculopathy, lumbar region      Meds Administered:  Meds ordered this encounter  Medications   bupivacaine  (MARCAINE ) 0.5 % (with pres) injection 3 mL     Laterality: Bilateral  Location/Site: L4-L5, L3 and L4 medial branches  Needle: 5.0 in., 25 ga.  Short bevel or Quincke spinal needle  Needle Placement: Oblique pedical  Findings:   -Comments: There was excellent flow of contrast along the articular pillars without intravascular flow.  Procedure Details: The fluoroscope beam is vertically oriented in AP and then obliqued 15 to 20 degrees to the ipsilateral side of the desired nerve to achieve the Scotty dog appearance.  The skin over the target area of the junction of the superior articulating process and the transverse process (sacral ala if blocking the L5 dorsal rami) was locally anesthetized with a 1 ml volume of 1% Lidocaine  without Epinephrine .  The spinal needle was inserted and advanced in a trajectory view down to the target.   After contact with periosteum and negative aspirate for blood and CSF, correct placement without intravascular or epidural spread was confirmed by injecting 0.5 ml. of Isovue-250.  A spot radiograph was obtained of this image.    Next, a 0.5 ml. volume of the injectate described above was injected. The needle was then redirected to the other facet joint  nerves mentioned above if needed.  Prior to the procedure, the patient was given a Pain Diary which was completed for baseline measurements.  After the procedure, the patient rated their pain every 30 minutes and will continue rating at this frequency for a total of 5 hours.  The patient has been asked to complete the Diary and return to us  by mail, fax or hand delivered as soon as possible.   Additional Comments:  The patient tolerated the procedure well Dressing: 2 x 2 sterile gauze and Band-Aid    Post-procedure details: Patient was observed during the procedure. Post-procedure instructions were reviewed.  Patient left the clinic in stable condition.

## 2024-09-09 NOTE — Progress Notes (Signed)
 Pain Scale   Average Pain 3 Patient advising he has chronic lower back pain that is constant and increases when working , bending and lifting pain is constant.        +Driver, -BT, -Dye Allergies.

## 2024-09-09 NOTE — Progress Notes (Signed)
 "  Jerry Arroyo - 59 y.o. male MRN [id number]  Date of birth: 02/29/1966  Office Visit Note: Visit Date: 09/09/2024 PCP: Perri Ronal PARAS, MD Referred by: Perri Ronal PARAS, MD  Subjective: Chief Complaint  Patient presents with   Lower Back - Pain   HPI:  Jerry Arroyo is a 59 y.o. male who comes in today at the request of Duwaine Pouch, FNP for planned Bilateral  L4-5 Lumbar facet/medial branch block with fluoroscopic guidance.  The patient has failed conservative care including home exercise, medications, time and activity modification.  This injection will be diagnostic and hopefully therapeutic.  Please see requesting physician notes for further details and justification.  Exam has shown concordant pain with facet joint loading.   ROS Otherwise per HPI.  Assessment & Plan: Visit Diagnoses:    ICD-10-CM   1. Spondylosis without myelopathy or radiculopathy, lumbar region  M47.816 XR C-ARM NO REPORT    Nerve Block    bupivacaine  (MARCAINE ) 0.5 % (with pres) injection 3 mL      Plan: No additional findings.   Meds & Orders:  Meds ordered this encounter  Medications   bupivacaine  (MARCAINE ) 0.5 % (with pres) injection 3 mL    Orders Placed This Encounter  Procedures   Nerve Block   XR C-ARM NO REPORT    Follow-up: Return for Review Pain Diary.   Procedures: No procedures performed  Lumbar Diagnostic Facet Joint Nerve Block with Fluoroscopic Guidance   Patient: Jerry Arroyo      Date of Birth: 1966-04-10 MRN: [id number] PCP: Perri Ronal PARAS, MD      Visit Date: 09/09/2024   Universal Protocol:    Date/Time: 1/6/20264:23 PM  Consent Given By: the patient  Position: PRONE  Additional Comments: Vital signs were monitored before and after the procedure. Patient was prepped and draped in the usual sterile fashion. The correct patient, procedure, and site was verified.   Injection Procedure Details:   Procedure diagnoses:  1. Spondylosis without myelopathy  or radiculopathy, lumbar region      Meds Administered:  Meds ordered this encounter  Medications   bupivacaine  (MARCAINE ) 0.5 % (with pres) injection 3 mL     Laterality: Bilateral  Location/Site: L4-L5, L3 and L4 medial branches  Needle: 5.0 in., 25 ga.  Short bevel or Quincke spinal needle  Needle Placement: Oblique pedical  Findings:   -Comments: There was excellent flow of contrast along the articular pillars without intravascular flow.  Procedure Details: The fluoroscope beam is vertically oriented in AP and then obliqued 15 to 20 degrees to the ipsilateral side of the desired nerve to achieve the Scotty dog appearance.  The skin over the target area of the junction of the superior articulating process and the transverse process (sacral ala if blocking the L5 dorsal rami) was locally anesthetized with a 1 ml volume of 1% Lidocaine  without Epinephrine .  The spinal needle was inserted and advanced in a trajectory view down to the target.   After contact with periosteum and negative aspirate for blood and CSF, correct placement without intravascular or epidural spread was confirmed by injecting 0.5 ml. of Isovue-250.  A spot radiograph was obtained of this image.    Next, a 0.5 ml. volume of the injectate described above was injected. The needle was then redirected to the other facet joint nerves mentioned above if needed.  Prior to the procedure, the patient was given a Pain Diary which was completed for baseline measurements.  After the procedure, the patient rated their pain every 30 minutes and will continue rating at this frequency for a total of 5 hours.  The patient has been asked to complete the Diary and return to us  by mail, fax or hand delivered as soon as possible.   Additional Comments:  The patient tolerated the procedure well Dressing: 2 x 2 sterile gauze and Band-Aid    Post-procedure details: Patient was observed during the procedure. Post-procedure  instructions were reviewed.  Patient left the clinic in stable condition.   Clinical History: MRI LUMBAR SPINE WITHOUT CONTRAST   TECHNIQUE: Multiplanar, multisequence MR imaging of the lumbar spine was performed. No intravenous contrast was administered.   COMPARISON:  Lumbar spine radiographs 06/26/2023   FINDINGS: Segmentation:  Standard.   Alignment:  Normal.   Vertebrae: No fracture or suspicious marrow lesion. Moderate right greater than left facet edema at L4-5.   Conus medullaris and cauda equina: Conus extends to the lower T12 level. Conus and cauda equina appear normal.   Paraspinal and other soft tissues: Unremarkable.   Disc levels:   T12-L1 and L1-2: Negative.   L2-3: Disc desiccation. Mild disc bulging, a small left foraminal disc protrusion, and mild facet hypertrophy without significant stenosis.   L3-4: Disc desiccation. Disc bulging and mild facet hypertrophy result in mild bilateral lateral recess stenosis and mild bilateral neural foraminal stenosis without spinal stenosis.   L4-5: Disc desiccation. Circumferential disc bulging, a superimposed central disc protrusion, and severe facet arthrosis result in mild spinal stenosis, moderate right greater than left lateral recess stenosis, and mild-to-moderate bilateral neural foraminal stenosis. The L5 nerve roots may be affected in the lateral recesses.   L5-S1: Normal disc.  Mild facet hypertrophy without stenosis.   IMPRESSION: 1. 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 2. Mild spinal stenosis, moderate lateral recess stenosis, and mild-to-moderate neural foraminal stenosis at L4-5. 3. Mild lateral recess and neural foraminal stenosis at L3-4.     Electronically Signed   By: Dasie Hamburg M.D.   On: 10/05/2023 15:11     Objective:  VS:  HT:    WT:   BMI:     BP:123/80  HR:71bpm  TEMP: ( )  RESP:  Physical Exam Vitals and nursing note reviewed.   Constitutional:      General: He is not in acute distress.    Appearance: Normal appearance. He is not ill-appearing.  HENT:     Head: Normocephalic and atraumatic.     Right Ear: External ear normal.     Left Ear: External ear normal.     Nose: No congestion.  Eyes:     Extraocular Movements: Extraocular movements intact.  Cardiovascular:     Rate and Rhythm: Normal rate.     Pulses: Normal pulses.  Pulmonary:     Effort: Pulmonary effort is normal. No respiratory distress.  Abdominal:     General: There is no distension.     Palpations: Abdomen is soft.  Musculoskeletal:        General: No tenderness or signs of injury.     Cervical back: Neck supple.     Right lower leg: No edema.     Left lower leg: No edema.     Comments: Patient has good distal strength without clonus.  Skin:    Findings: No erythema or rash.  Neurological:     General: No focal deficit present.     Mental Status: He  is alert and oriented to person, place, and time.     Sensory: No sensory deficit.     Motor: No weakness or abnormal muscle tone.     Coordination: Coordination normal.  Psychiatric:        Mood and Affect: Mood normal.        Behavior: Behavior normal.      Imaging: No results found. "

## 2024-09-10 ENCOUNTER — Encounter: Payer: Self-pay | Admitting: Physical Medicine and Rehabilitation

## 2024-09-10 ENCOUNTER — Other Ambulatory Visit: Payer: Self-pay | Admitting: Physical Medicine and Rehabilitation

## 2024-09-10 DIAGNOSIS — M47816 Spondylosis without myelopathy or radiculopathy, lumbar region: Secondary | ICD-10-CM

## 2024-09-10 DIAGNOSIS — M545 Low back pain, unspecified: Secondary | ICD-10-CM

## 2024-09-11 ENCOUNTER — Telehealth: Payer: Self-pay | Admitting: Physical Medicine and Rehabilitation

## 2024-09-11 NOTE — Telephone Encounter (Signed)
 Patient called. Says he sent the diary on mychart, did you receive it?

## 2024-09-12 ENCOUNTER — Ambulatory Visit: Admitting: Internal Medicine

## 2024-09-30 ENCOUNTER — Ambulatory Visit: Admitting: Physical Medicine and Rehabilitation

## 2024-09-30 ENCOUNTER — Other Ambulatory Visit: Payer: Self-pay

## 2024-09-30 VITALS — BP 148/88 | HR 72

## 2024-09-30 DIAGNOSIS — M47816 Spondylosis without myelopathy or radiculopathy, lumbar region: Secondary | ICD-10-CM | POA: Diagnosis not present

## 2024-09-30 MED ORDER — BUPIVACAINE HCL 0.5 % IJ SOLN
3.0000 mL | Freq: Once | INTRAMUSCULAR | Status: AC
  Administered 2024-09-30: 3 mL

## 2024-09-30 NOTE — Procedures (Signed)
 Lumbar Diagnostic Facet Joint Nerve Block with Fluoroscopic Guidance   Patient: Jerry Arroyo      Date of Birth: [birth year] MRN: [id number] PCP: Perri Ronal PARAS, MD      Visit Date: 09/30/2024   Universal Protocol:    Date/Time: 01/27/261:26 PM  Consent Given By: the patient  Position: PRONE  Additional Comments: Vital signs were monitored before and after the procedure. Patient was prepped and draped in the usual sterile fashion. The correct patient, procedure, and site was verified.   Injection Procedure Details:   Procedure diagnoses:  1. Spondylosis without myelopathy or radiculopathy, lumbar region      Meds Administered:  Meds ordered this encounter  Medications   bupivacaine  (MARCAINE ) 0.5 % (with pres) injection 3 mL     Laterality: Bilateral  Location/Site: L4-L5, L3 and L4 medial branches  Needle: 5.0 in., 25 ga.  Short bevel or Quincke spinal needle  Needle Placement: Oblique pedical  Findings:   -Comments: There was excellent flow of contrast along the articular pillars without intravascular flow.  Procedure Details: The fluoroscope beam is vertically oriented in AP and then obliqued 15 to 20 degrees to the ipsilateral side of the desired nerve to achieve the Scotty dog appearance.  The skin over the target area of the junction of the superior articulating process and the transverse process (sacral ala if blocking the L5 dorsal rami) was locally anesthetized with a 1 ml volume of 1% Lidocaine  without Epinephrine .  The spinal needle was inserted and advanced in a trajectory view down to the target.   After contact with periosteum and negative aspirate for blood and CSF, correct placement without intravascular or epidural spread was confirmed by injecting 0.5 ml. of Isovue-250.  A spot radiograph was obtained of this image.    Next, a 0.5 ml. volume of the injectate described above was injected. The needle was then redirected to the other facet joint  nerves mentioned above if needed.  Prior to the procedure, the patient was given a Pain Diary which was completed for baseline measurements.  After the procedure, the patient rated their pain every 30 minutes and will continue rating at this frequency for a total of 5 hours.  The patient has been asked to complete the Diary and return to us  by mail, fax or hand delivered as soon as possible.   Additional Comments:  The patient tolerated the procedure well Dressing: 2 x 2 sterile gauze and Band-Aid    Post-procedure details: Patient was observed during the procedure. Post-procedure instructions were reviewed.  Patient left the clinic in stable condition.

## 2024-09-30 NOTE — Progress Notes (Signed)
 Pain Scale   Average Pain 5 Patient advising he has lower back that increases when walking and standing. Pain decreases when sitting and resting.         +Driver, -BT, -Dye Allergies.

## 2024-09-30 NOTE — Progress Notes (Signed)
 "  Jerry Arroyo - 59 y.o. male MRN [id number]  Date of birth: 1966/05/13  Office Visit Note: Visit Date: 09/30/2024 PCP: Perri Ronal PARAS, MD Referred by: Perri Ronal PARAS, MD  Subjective: Chief Complaint  Patient presents with   Lower Back - Pain   HPI:  Jerry Arroyo is a 59 y.o. male who comes in today for planned repeat Bilateral L4-5 Lumbar facet/medial branch block with fluoroscopic guidance.  The patient has failed conservative care including home exercise, medications, time and activity modification.  This injection will be diagnostic and hopefully therapeutic.  Please see requesting physician notes for further details and justification.  Exam shows concordant low back pain with facet joint loading and extension. Patient received more than 80% pain relief from prior injection. This would be the second block in a diagnostic double block paradigm.     Referring:Megan Trudy, FNP   ROS Otherwise per HPI.  Assessment & Plan: Visit Diagnoses:    ICD-10-CM   1. Spondylosis without myelopathy or radiculopathy, lumbar region  M47.816 XR C-ARM NO REPORT    Nerve Block    bupivacaine  (MARCAINE ) 0.5 % (with pres) injection 3 mL      Plan: No additional findings.   Meds & Orders:  Meds ordered this encounter  Medications   bupivacaine  (MARCAINE ) 0.5 % (with pres) injection 3 mL    Orders Placed This Encounter  Procedures   Nerve Block   XR C-ARM NO REPORT    Follow-up: Return for Review Pain Diary.   Procedures: No procedures performed  Lumbar Diagnostic Facet Joint Nerve Block with Fluoroscopic Guidance   Patient: Jerry Arroyo      Date of Birth: 07/27/1966 MRN: [id number] PCP: Perri Ronal PARAS, MD      Visit Date: 09/30/2024   Universal Protocol:    Date/Time: 01/27/261:26 PM  Consent Given By: the patient  Position: PRONE  Additional Comments: Vital signs were monitored before and after the procedure. Patient was prepped and draped in the usual  sterile fashion. The correct patient, procedure, and site was verified.   Injection Procedure Details:   Procedure diagnoses:  1. Spondylosis without myelopathy or radiculopathy, lumbar region      Meds Administered:  Meds ordered this encounter  Medications   bupivacaine  (MARCAINE ) 0.5 % (with pres) injection 3 mL     Laterality: Bilateral  Location/Site: L4-L5, L3 and L4 medial branches  Needle: 5.0 in., 25 ga.  Short bevel or Quincke spinal needle  Needle Placement: Oblique pedical  Findings:   -Comments: There was excellent flow of contrast along the articular pillars without intravascular flow.  Procedure Details: The fluoroscope beam is vertically oriented in AP and then obliqued 15 to 20 degrees to the ipsilateral side of the desired nerve to achieve the Scotty dog appearance.  The skin over the target area of the junction of the superior articulating process and the transverse process (sacral ala if blocking the L5 dorsal rami) was locally anesthetized with a 1 ml volume of 1% Lidocaine  without Epinephrine .  The spinal needle was inserted and advanced in a trajectory view down to the target.   After contact with periosteum and negative aspirate for blood and CSF, correct placement without intravascular or epidural spread was confirmed by injecting 0.5 ml. of Isovue-250.  A spot radiograph was obtained of this image.    Next, a 0.5 ml. volume of the injectate described above was injected. The needle was then redirected to the other facet  joint nerves mentioned above if needed.  Prior to the procedure, the patient was given a Pain Diary which was completed for baseline measurements.  After the procedure, the patient rated their pain every 30 minutes and will continue rating at this frequency for a total of 5 hours.  The patient has been asked to complete the Diary and return to us  by mail, fax or hand delivered as soon as possible.   Additional Comments:  The patient  tolerated the procedure well Dressing: 2 x 2 sterile gauze and Band-Aid    Post-procedure details: Patient was observed during the procedure. Post-procedure instructions were reviewed.  Patient left the clinic in stable condition.   Clinical History: MRI LUMBAR SPINE WITHOUT CONTRAST   TECHNIQUE: Multiplanar, multisequence MR imaging of the lumbar spine was performed. No intravenous contrast was administered.   COMPARISON:  Lumbar spine radiographs 06/26/2023   FINDINGS: Segmentation:  Standard.   Alignment:  Normal.   Vertebrae: No fracture or suspicious marrow lesion. Moderate right greater than left facet edema at L4-5.   Conus medullaris and cauda equina: Conus extends to the lower T12 level. Conus and cauda equina appear normal.   Paraspinal and other soft tissues: Unremarkable.   Disc levels:   T12-L1 and L1-2: Negative.   L2-3: Disc desiccation. Mild disc bulging, a small left foraminal disc protrusion, and mild facet hypertrophy without significant stenosis.   L3-4: Disc desiccation. Disc bulging and mild facet hypertrophy result in mild bilateral lateral recess stenosis and mild bilateral neural foraminal stenosis without spinal stenosis.   L4-5: Disc desiccation. Circumferential disc bulging, a superimposed central disc protrusion, and severe facet arthrosis result in mild spinal stenosis, moderate right greater than left lateral recess stenosis, and mild-to-moderate bilateral neural foraminal stenosis. The L5 nerve roots may be affected in the lateral recesses.   L5-S1: Normal disc.  Mild facet hypertrophy without stenosis.   IMPRESSION: 1. 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 2. Mild spinal stenosis, moderate lateral recess stenosis, and mild-to-moderate neural foraminal stenosis at L4-5. 3. Mild lateral recess and neural foraminal stenosis at L3-4.     Electronically Signed   By: Dasie Hamburg M.D.   On:  10/05/2023 15:11     Objective:  VS:  HT:    WT:   BMI:     BP:(!) 148/88  HR:72bpm  TEMP: ( )  RESP:  Physical Exam Vitals and nursing note reviewed.  Constitutional:      General: He is not in acute distress.    Appearance: Normal appearance. He is not ill-appearing.  HENT:     Head: Normocephalic and atraumatic.     Right Ear: External ear normal.     Left Ear: External ear normal.     Nose: No congestion.  Eyes:     Extraocular Movements: Extraocular movements intact.  Cardiovascular:     Rate and Rhythm: Normal rate.     Pulses: Normal pulses.  Pulmonary:     Effort: Pulmonary effort is normal. No respiratory distress.  Abdominal:     General: There is no distension.     Palpations: Abdomen is soft.  Musculoskeletal:        General: No tenderness or signs of injury.     Cervical back: Neck supple.     Right lower leg: No edema.     Left lower leg: No edema.     Comments: Patient has good distal strength without clonus.  Skin:  Findings: No erythema or rash.  Neurological:     General: No focal deficit present.     Mental Status: He is alert and oriented to person, place, and time.     Sensory: No sensory deficit.     Motor: No weakness or abnormal muscle tone.     Coordination: Coordination normal.  Psychiatric:        Mood and Affect: Mood normal.        Behavior: Behavior normal.      Imaging: No results found. "

## 2024-10-01 ENCOUNTER — Other Ambulatory Visit: Payer: Self-pay | Admitting: Physical Medicine and Rehabilitation

## 2024-10-01 DIAGNOSIS — M47816 Spondylosis without myelopathy or radiculopathy, lumbar region: Secondary | ICD-10-CM

## 2024-10-01 DIAGNOSIS — G8929 Other chronic pain: Secondary | ICD-10-CM

## 2024-10-01 MED ORDER — DIAZEPAM 5 MG PO TABS: ORAL_TABLET | ORAL | 0 refills | Status: AC

## 2024-10-02 ENCOUNTER — Encounter: Payer: Self-pay | Admitting: Gastroenterology

## 2024-10-23 ENCOUNTER — Encounter: Admitting: Physical Medicine and Rehabilitation

## 2024-11-07 ENCOUNTER — Ambulatory Visit: Admitting: Internal Medicine
